# Patient Record
Sex: Female | Born: 1940
Health system: Southern US, Community
[De-identification: ages and names within clinical notes are randomized; demographics above are authoritative.]

## PROBLEM LIST (undated history)

## (undated) DIAGNOSIS — R202 Paresthesia of skin: Secondary | ICD-10-CM

## (undated) DIAGNOSIS — K219 Gastro-esophageal reflux disease without esophagitis: Secondary | ICD-10-CM

## (undated) DIAGNOSIS — M542 Cervicalgia: Secondary | ICD-10-CM

## (undated) DIAGNOSIS — I10 Essential (primary) hypertension: Secondary | ICD-10-CM

## (undated) DIAGNOSIS — J189 Pneumonia, unspecified organism: Secondary | ICD-10-CM

## (undated) DIAGNOSIS — M545 Low back pain, unspecified: Secondary | ICD-10-CM

## (undated) DIAGNOSIS — R011 Cardiac murmur, unspecified: Secondary | ICD-10-CM

## (undated) DIAGNOSIS — I6529 Occlusion and stenosis of unspecified carotid artery: Secondary | ICD-10-CM

## (undated) DIAGNOSIS — R519 Headache, unspecified: Secondary | ICD-10-CM

## (undated) DIAGNOSIS — E785 Hyperlipidemia, unspecified: Secondary | ICD-10-CM

## (undated) DIAGNOSIS — G43909 Migraine, unspecified, not intractable, without status migrainosus: Secondary | ICD-10-CM

## (undated) DIAGNOSIS — F329 Major depressive disorder, single episode, unspecified: Secondary | ICD-10-CM

## (undated) DIAGNOSIS — I2510 Atherosclerotic heart disease of native coronary artery without angina pectoris: Secondary | ICD-10-CM

## (undated) DIAGNOSIS — R251 Tremor, unspecified: Secondary | ICD-10-CM

## (undated) DIAGNOSIS — G8929 Other chronic pain: Secondary | ICD-10-CM

## (undated) DIAGNOSIS — R51 Headache: Secondary | ICD-10-CM

## (undated) DIAGNOSIS — E119 Type 2 diabetes mellitus without complications: Secondary | ICD-10-CM

## (undated) DIAGNOSIS — R42 Dizziness and giddiness: Secondary | ICD-10-CM

## (undated) DIAGNOSIS — F32A Depression, unspecified: Secondary | ICD-10-CM

## (undated) HISTORY — PX: ESOPHAGOGASTRODUODENOSCOPY: SHX1529

## (undated) HISTORY — PX: COLONOSCOPY: SHX174

## (undated) HISTORY — PX: COLON SURGERY: SHX602

## (undated) HISTORY — PX: ABDOMINAL HYSTERECTOMY: SHX81

## (undated) HISTORY — PX: LAPAROSCOPIC CHOLECYSTECTOMY: SUR755

## (undated) HISTORY — PX: TUBAL LIGATION: SHX77

## (undated) HISTORY — PX: CARPAL TUNNEL RELEASE: SHX101

---

## 2011-12-13 ENCOUNTER — Encounter (HOSPITAL_COMMUNITY)
Admission: RE | Admit: 2011-12-13 | Discharge: 2011-12-13 | Disposition: A | Discharge status: Home / Self Care | Payer: Medicare Other | Source: Ambulatory Visit | Attending: Urology | Admitting: Urology

## 2011-12-13 ENCOUNTER — Encounter (HOSPITAL_COMMUNITY): Payer: Self-pay | Admitting: Pharmacy Technician

## 2011-12-13 ENCOUNTER — Encounter (HOSPITAL_COMMUNITY): Payer: Self-pay

## 2011-12-13 ENCOUNTER — Other Ambulatory Visit: Payer: Self-pay

## 2011-12-13 HISTORY — DX: Essential (primary) hypertension: I10

## 2011-12-13 LAB — DIFFERENTIAL
Basophils Relative: 0 % (ref 0–1)
Eosinophils Absolute: 0.1 10*3/uL (ref 0.0–0.7)
Eosinophils Relative: 1 % (ref 0–5)
Monocytes Absolute: 0.6 10*3/uL (ref 0.1–1.0)
Monocytes Relative: 8 % (ref 3–12)
Neutrophils Relative %: 66 % (ref 43–77)

## 2011-12-13 LAB — CBC
HCT: 38.8 % (ref 36.0–46.0)
Hemoglobin: 12.9 g/dL (ref 12.0–15.0)
MCH: 28.4 pg (ref 26.0–34.0)
MCHC: 33.2 g/dL (ref 30.0–36.0)
MCV: 85.3 fL (ref 78.0–100.0)

## 2011-12-13 LAB — BASIC METABOLIC PANEL
BUN: 32 mg/dL — ABNORMAL HIGH (ref 6–23)
Calcium: 10 mg/dL (ref 8.4–10.5)
Creatinine, Ser: 0.92 mg/dL (ref 0.50–1.10)
GFR calc Af Amer: 72 mL/min — ABNORMAL LOW (ref >90)
GFR calc non Af Amer: 62 mL/min — ABNORMAL LOW (ref >90)
Glucose, Bld: 111 mg/dL — ABNORMAL HIGH (ref 70–99)

## 2011-12-13 LAB — SURGICAL PCR SCREEN: MRSA, PCR: NEGATIVE

## 2011-12-13 NOTE — Patient Instructions (Signed)
PATIENT INSTRUCTIONS POST-ANESTHESIA  IMMEDIATELY FOLLOWING SURGERY:  Do not drive or operate machinery for the first twenty four hours after surgery.  Do not make any important decisions for twenty four hours after surgery or while taking narcotic pain medications or sedatives.  If you develop intractable nausea and vomiting or a severe headache please notify your doctor immediately.  FOLLOW-UP:  Please make an appointment with your surgeon as instructed. You do not need to follow up with anesthesia unless specifically instructed to do so.  WOUND CARE INSTRUCTIONS (if applicable):  Keep a dry clean dressing on the anesthesia/puncture wound site if there is drainage.  Once the wound has quit draining you may leave it open to air.  Generally you should leave the bandage intact for twenty four hours unless there is drainage.  If the epidural site drains for more than 36-48 hours please call the anesthesia department.  QUESTIONS?:  Please feel free to call your physician or the hospital operator if you have any questions, and they will be happy to assist you.     [facility] Anesthesia Department [address] [place] [phone]    20 Lillianne Eick  12/13/2011   Your procedure is scheduled on:  12/14/11  Report to [facility] at 1215 pm  Call this number if you have problems the morning of surgery: [phone]   Remember:   Do not eat food:After Midnight.  May have clear liquids:until Midnight .  Clear liquids include soda, tea, black coffee, apple or grape juice, broth.  Take these medicines the morning of surgery with A SIP OF WATER: valsartan, omperazole, simvastatin,propanolol   Do not wear jewelry, make-up or nail polish.  Do not wear lotions, powders, or perfumes. You may wear deodorant.  Do not shave 48 hours prior to surgery.  Do not bring valuables to the hospital.  Contacts, dentures or bridgework may not be worn into surgery.  Leave suitcase in the car. After  surgery it may be brought to your room.  For patients admitted to the hospital, checkout time is 11:00 AM the day of discharge.   Patients discharged the day of surgery will not be allowed to drive home.  Name and phone number of your driver: husband  Special Instructions: CHG Shower Use Special Wash: 1/2 bottle night before surgery and 1/2 bottle morning of surgery.   Please read over the following fact sheets that you were given: Pain Booklet, MRSA Information, Surgical Site Infection Prevention, Anesthesia Post-op Instructions and Care and Recovery After Surgery

## 2011-12-14 ENCOUNTER — Ambulatory Visit (HOSPITAL_COMMUNITY): Payer: Medicare Other | Admitting: Anesthesiology

## 2011-12-14 ENCOUNTER — Other Ambulatory Visit: Payer: Self-pay | Admitting: Urology

## 2011-12-14 ENCOUNTER — Encounter (HOSPITAL_COMMUNITY): Payer: Self-pay | Admitting: Anesthesiology

## 2011-12-14 ENCOUNTER — Encounter (HOSPITAL_COMMUNITY)
Admission: RE | Disposition: A | Discharge status: Home / Self Care | Payer: Self-pay | Source: Ambulatory Visit | Attending: Urology

## 2011-12-14 ENCOUNTER — Encounter (HOSPITAL_COMMUNITY): Payer: Self-pay | Admitting: *Deleted

## 2011-12-14 ENCOUNTER — Ambulatory Visit (HOSPITAL_COMMUNITY)
Admission: RE | Admit: 2011-12-14 | Discharge: 2011-12-14 | Disposition: A | Discharge status: Home / Self Care | Payer: Medicare Other | Source: Ambulatory Visit | Attending: Urology | Admitting: Urology

## 2011-12-14 DIAGNOSIS — N3020 Other chronic cystitis without hematuria: Secondary | ICD-10-CM

## 2011-12-14 DIAGNOSIS — I10 Essential (primary) hypertension: Secondary | ICD-10-CM | POA: Insufficient documentation

## 2011-12-14 DIAGNOSIS — E119 Type 2 diabetes mellitus without complications: Secondary | ICD-10-CM | POA: Insufficient documentation

## 2011-12-14 DIAGNOSIS — Z79899 Other long term (current) drug therapy: Secondary | ICD-10-CM | POA: Insufficient documentation

## 2011-12-14 DIAGNOSIS — Z01812 Encounter for preprocedural laboratory examination: Secondary | ICD-10-CM | POA: Insufficient documentation

## 2011-12-14 HISTORY — PX: CYSTOSCOPY WITH BIOPSY: SHX5122

## 2011-12-14 HISTORY — PX: CYSTOSCOPY WITH URETHRAL DILATATION: SHX5125

## 2011-12-14 SURGERY — CYSTOSCOPY, WITH BIOPSY
Anesthesia: Monitor Anesthesia Care | Site: Bladder | Wound class: Clean Contaminated

## 2011-12-14 MED ORDER — CIPROFLOXACIN IN D5W 400 MG/200ML IV SOLN
400.0000 mg | Freq: Two times a day (BID) | INTRAVENOUS | Status: DC
  Administered 2011-12-14: 400 mg via INTRAVENOUS

## 2011-12-14 MED ORDER — FENTANYL CITRATE 0.05 MG/ML IJ SOLN
INTRAMUSCULAR | Status: AC
  Filled 2011-12-14: qty 2

## 2011-12-14 MED ORDER — MIDAZOLAM HCL 2 MG/2ML IJ SOLN
INTRAMUSCULAR | Status: AC
  Filled 2011-12-14: qty 2

## 2011-12-14 MED ORDER — FENTANYL CITRATE 0.05 MG/ML IJ SOLN
25.0000 ug | INTRAMUSCULAR | Status: DC | PRN
  Administered 2011-12-14 (×2): 25 ug via INTRAVENOUS

## 2011-12-14 MED ORDER — FENTANYL CITRATE 0.05 MG/ML IJ SOLN
INTRAMUSCULAR | Status: DC | PRN
  Administered 2011-12-14 (×4): 50 ug via INTRAVENOUS

## 2011-12-14 MED ORDER — FLUCONAZOLE 150 MG PO TABS: 150.0000 mg | ORAL_TABLET | Freq: Every day | ORAL | Status: DC

## 2011-12-14 MED ORDER — SODIUM CHLORIDE 0.9 % IR SOLN: Status: DC | PRN

## 2011-12-14 MED ORDER — GLYCINE 1.5 % IR SOLN
Status: DC | PRN
  Administered 2011-12-14: 6000 mL

## 2011-12-14 MED ORDER — LIDOCAINE HCL (CARDIAC) 10 MG/ML IV SOLN
INTRAVENOUS | Status: DC | PRN
  Administered 2011-12-14: 50 mg via INTRAVENOUS

## 2011-12-14 MED ORDER — STERILE WATER FOR IRRIGATION IR SOLN
Status: DC | PRN
  Administered 2011-12-14: 1000 mL

## 2011-12-14 MED ORDER — ETOMIDATE 2 MG/ML IV SOLN
INTRAVENOUS | Status: AC
  Filled 2011-12-14: qty 10

## 2011-12-14 MED ORDER — LIDOCAINE VISCOUS 2 % MT SOLN
OROMUCOSAL | Status: DC | PRN
  Administered 2011-12-14: 1

## 2011-12-14 MED ORDER — FLUCONAZOLE 100 MG PO TABS
150.0000 mg | ORAL_TABLET | Freq: Every day | ORAL | Status: AC
  Administered 2011-12-14: 150 mg via ORAL
  Filled 2011-12-14: qty 1.5

## 2011-12-14 MED ORDER — LACTATED RINGERS IV SOLN
INTRAVENOUS | Status: DC | PRN
  Administered 2011-12-14: 13:00:00 via INTRAVENOUS

## 2011-12-14 MED ORDER — ONDANSETRON HCL 4 MG/2ML IJ SOLN: 4.0000 mg | Freq: Once | INTRAMUSCULAR | Status: DC | PRN

## 2011-12-14 MED ORDER — FENTANYL CITRATE 0.05 MG/ML IJ SOLN
INTRAMUSCULAR | Status: AC
  Administered 2011-12-14: 25 ug via INTRAVENOUS
  Filled 2011-12-14: qty 2

## 2011-12-14 MED ORDER — FLUCONAZOLE 150 MG PO TABS: 150.0000 mg | ORAL_TABLET | Freq: Once | ORAL | Status: AC

## 2011-12-14 MED ORDER — PROPOFOL 10 MG/ML IV EMUL
INTRAVENOUS | Status: AC
  Filled 2011-12-14: qty 20

## 2011-12-14 MED ORDER — CIPROFLOXACIN IN D5W 400 MG/200ML IV SOLN
INTRAVENOUS | Status: DC | PRN
  Administered 2011-12-14: 200 mg via INTRAVENOUS

## 2011-12-14 MED ORDER — LACTATED RINGERS IV SOLN
INTRAVENOUS | Status: DC
  Administered 2011-12-14: 13:00:00 via INTRAVENOUS

## 2011-12-14 MED ORDER — LIDOCAINE HCL (PF) 1 % IJ SOLN
INTRAMUSCULAR | Status: AC
  Filled 2011-12-14: qty 5

## 2011-12-14 MED ORDER — MIDAZOLAM HCL 5 MG/5ML IJ SOLN
INTRAMUSCULAR | Status: DC | PRN
  Administered 2011-12-14: 2 mg via INTRAVENOUS

## 2011-12-14 MED ORDER — PROPOFOL 10 MG/ML IV EMUL
INTRAVENOUS | Status: DC | PRN
  Administered 2011-12-14: 100 ug/kg/min via INTRAVENOUS

## 2011-12-14 MED ORDER — MIDAZOLAM HCL 2 MG/2ML IJ SOLN
1.0000 mg | INTRAMUSCULAR | Status: DC | PRN
  Administered 2011-12-14: 2 mg via INTRAVENOUS

## 2011-12-14 MED ORDER — LIDOCAINE VISCOUS 2 % MT SOLN
OROMUCOSAL | Status: AC
  Filled 2011-12-14: qty 15

## 2011-12-14 SURGICAL SUPPLY — 20 items
BAG DRAIN URO TABLE W/ADPT NS (DRAPE) ×3 IMPLANT
BAG HAMPER (MISCELLANEOUS) ×3 IMPLANT
CLOTH BEACON ORANGE TIMEOUT ST (SAFETY) ×3 IMPLANT
FORMALIN 10 PREFIL 120ML (MISCELLANEOUS) IMPLANT
GLOVE BIO SURGEON STRL SZ7 (GLOVE) ×3 IMPLANT
GLOVE ECLIPSE 7.0 STRL STRAW (GLOVE) ×3 IMPLANT
GLOVE EXAM NITRILE MD LF STRL (GLOVE) ×3 IMPLANT
GLOVE SS BIOGEL STRL SZ 6.5 (GLOVE) ×2 IMPLANT
GLOVE SUPERSENSE BIOGEL SZ 6.5 (GLOVE) ×1
GLYCINE 1.5% IRRIG UROMATIC (IV SOLUTION) ×6 IMPLANT
GOWN STRL REIN XL XLG (GOWN DISPOSABLE) ×3 IMPLANT
IV NS IRRIG 3000ML ARTHROMATIC (IV SOLUTION) IMPLANT
KIT ROOM TURNOVER AP CYSTO (KITS) ×3 IMPLANT
MANIFOLD NEPTUNE II (INSTRUMENTS) ×3 IMPLANT
NEEDLE HYPO 18GX1.5 BLUNT FILL (NEEDLE) IMPLANT
PACK CYSTO (CUSTOM PROCEDURE TRAY) ×3 IMPLANT
PAD ARMBOARD 7.5X6 YLW CONV (MISCELLANEOUS) ×3 IMPLANT
PAD TELFA 3X4 1S STER (GAUZE/BANDAGES/DRESSINGS) ×3 IMPLANT
TOWEL OR 17X26 4PK STRL BLUE (TOWEL DISPOSABLE) ×3 IMPLANT
WATER STERILE IRR 1000ML POUR (IV SOLUTION) ×3 IMPLANT

## 2011-12-14 NOTE — Preoperative (Signed)
Beta Blockers   Reason not to administer Beta Blockers:Not Applicable 

## 2011-12-14 NOTE — Transfer of Care (Signed)
Immediate Anesthesia Transfer of Care Note  Patient: Alexa Mcpherson  Procedure(s) Performed:  CYSTOSCOPY WITH BIOPSY - Cystoscopy with Bladder Biopsies  Patient Location: PACU  Anesthesia Type: MAC  Level of Consciousness: awake, alert , oriented and patient cooperative  Airway & Oxygen Therapy: Patient Spontanous Breathing and Patient connected to face mask oxygen  Post-op Assessment: Report given to PACU RN and Post -op Vital signs reviewed and stable  Post vital signs: Reviewed and stable  Complications: No apparent anesthesia complications

## 2011-12-14 NOTE — Anesthesia Preprocedure Evaluation (Addendum)
Anesthesia Evaluation  Patient identified by MRN, date of birth, ID band Patient awake    Reviewed: Allergy & Precautions, H&P , NPO status , Patient's Chart, lab work & pertinent test results, reviewed documented beta blocker date and time   History of Anesthesia Complications Negative for: history of anesthetic complications  Airway Mallampati: II      Dental  (+) Teeth Intact   Pulmonary neg pulmonary ROS,  clear to auscultation        Cardiovascular hypertension, Pt. on medications Regular Normal    Neuro/Psych    GI/Hepatic GERD-  Medicated and Controlled,  Endo/Other  Diabetes mellitus-, Well Controlled, Type 2, Oral Hypoglycemic Agents  Renal/GU      Musculoskeletal   Abdominal   Peds  Hematology   Anesthesia Other Findings   Reproductive/Obstetrics                           Anesthesia Physical Anesthesia Plan  ASA: III  Anesthesia Plan: MAC   Post-op Pain Management:    Induction: Intravenous  Airway Management Planned: Nasal Cannula  Additional Equipment:   Intra-op Plan:   Post-operative Plan:   Informed Consent: I have reviewed the patients History and Physical, chart, labs and discussed the procedure including the risks, benefits and alternatives for the proposed anesthesia with the patient or authorized representative who has indicated his/her understanding and acceptance.     Plan Discussed with:   Anesthesia Plan Comments:         Anesthesia Quick Evaluation

## 2011-12-14 NOTE — Progress Notes (Signed)
Pt reexamined and no change in H& P. 

## 2011-12-14 NOTE — Brief Op Note (Signed)
12/14/2011  1:45 PM  PATIENT:  Alexa Mcpherson  70 y.o. female  PRE-OPERATIVE DIAGNOSIS:  chronic cystitis  POST-OPERATIVE DIAGNOSIS:  chronic cystitis  PROCEDURE:  Procedure(s): CYSTOSCOPY WITH BIOPSY  SURGEON:  Surgeon(s): Ky Barban  PHYSICIAN ASSISTANT:   ASSISTANTS: none   ANESTHESIA:   IV sedation  EBL:  Total I/O In: 1000 [I.V.:1000] Out: 0   BLOOD ADMINISTERED:none  DRAINS: none   LOCAL MEDICATIONS USED:  NONE  SPECIMEN:  Biopsy / Limited Resection  DISPOSITION OF SPECIMEN:  PATHOLOGY  COUNTS:  YES  TOURNIQUET:  * No tourniquets in log *  DICTATION: .Other Dictation: Dictation Number [id number]  PLAN OF CARE: Discharge to home after PACU  PATIENT DISPOSITION:  PACU - hemodynamically stable.   Delay start of Pharmacological VTE agent (>24hrs) due to surgical blood loss or risk of bleeding:  {YES

## 2011-12-14 NOTE — H&P (Signed)
NAMEROLENE, ANDRADES              ACCOUNT NO.:  [id number]  MEDICAL RECORD NO.:  [id number]  LOCATION:  DOIB                          FACILITY:  [facility]  PHYSICIAN:  Ky Barban, M.D.DATE OF BIRTH:  [birth year]  DATE OF ADMISSION:  12/13/2011 DATE OF DISCHARGE:  12/17/2012LH                             HISTORY & PHYSICAL   A 70 year old female is having lot of her urinary symptoms for several months.  She was initially followed up by Dr. Beatris Ship in [place] and now she comes over here complaining of urgency, frequency, pelvic pressure, and discomfort and I reviewed her records from Dr. Michiel Cowboy office.  She has several positive cultures and she was treated with antibiotics.  There is one time early this year that she says she is feeling better as far as frequency, urgency is concern, but she continued to complain of pelvic discomfort.  So, I cystoscoped her recently with CMG.  CMG was normal, but cystoscopy showed lot of ulceration in the bladder, which is consistent with chronic cystitis. So, I have suggested that we do bladder biopsy under anesthesia, which I discussed with the patient.  She understands, want me to go ahead and proceed with that.  She had several other medical problems, which include noninsulin-dependent diabetes, hypertension, migraines, backache, and depression.  SURGICAL HISTORY:  Cholecystectomy.  FAMILY HISTORY:  Father has multiple sclerosis.  PERSONAL HISTORY:  She does not smoke or drink.  REVIEW OF SYSTEMS:  Unremarkable.  PHYSICAL EXAMINATION:  VITAL SIGNS:  Blood pressure 160/80, temperature is normal. CENTRAL NERVOUS SYSTEM:  No gross neurological deficit. HEAD, NECK, EYE, ENT:  Negative. CHEST:  Symmetrical. HEART:  Regular sinus rhythm. ABDOMEN:  Soft, flat.  Liver, spleen, and kidneys are not palpable. PELVIC EXAM:  There is tenderness along the urethra and the trigone part of the bladder.  No adnexal mass or tenderness.  IMPRESSION:   Possible chronic cystitis, senile vaginitis, mixed incontinence, stress and urge incontinence, and backache.  PLAN:  Cysto with multiple bladder biopsies under anesthesia as outpatient.     Ky Barban, M.D.     MIJ/MEDQ  D:  12/13/2011  T:  12/14/2011  Job:  [id number]

## 2011-12-14 NOTE — Anesthesia Postprocedure Evaluation (Signed)
  Anesthesia Post-op Note  Patient: Alexa Mcpherson  Procedure(s) Performed:  CYSTOSCOPY WITH BIOPSY - Cystoscopy with Bladder Biopsies, Urethral Dilation, Bladder Fulguration  Patient Location: PACU  Anesthesia Type: MAC  Level of Consciousness: awake, alert , oriented and patient cooperative  Airway and Oxygen Therapy: Patient Spontanous Breathing and Patient connected to face mask oxygen  Post-op Pain: none  Post-op Assessment: Post-op Vital signs reviewed, Patient's Cardiovascular Status Stable, Respiratory Function Stable, Patent Airway, No signs of Nausea or vomiting and Pain level controlled  Post-op Vital Signs: Reviewed and stable  Complications: No apparent anesthesia complications

## 2011-12-15 NOTE — Op Note (Signed)
NAMEVELERA, Alexa Mcpherson              ACCOUNT NO.:  [id number]  MEDICAL RECORD NO.:  [id number]  LOCATION:                                 FACILITY:  PHYSICIAN:  Ky Barban, M.D.DATE OF BIRTH:  16-May-1941  DATE OF PROCEDURE: DATE OF DISCHARGE:                              OPERATIVE REPORT   PREOPERATIVE DIAGNOSIS:  Recurrent cystitis, chronic cystitis.  PROCEDURES:  Cysto bladder biopsy, fulguration of bladder mucosa, urethral dilation, hydrodistention of bladder.  ANESTHESIA:  IV MAC.  PROCEDURE:  The patient under MAC anesthesia in lithotomy position, usual prep and drape.  I could not get the cystoscope #25 in because the urethra is very tight.  I dilated the urethra with 36 [place] and introduced the cystoscope.  Bladder showed diffuse erythema in the posterior bladder wall with bullous edema and some of the areas of ulceration and bleeding spots and then using a flexible biopsy forceps a couple of biopsies from these areas were taken with the help of a flexible biopsy forceps.  Now using Greenwald electrode, I cauterized the mucosa to stop the bleeding and also I cauterized the bladder neck because there was lot of bullous edema around the bladder neck and the proximal urethra.  I did this away from the ureteral orifices, mostly behind the trigone on the posterior bladder wall.  Once this was done, there was no bleeding.  I checked the capacity of the bladder and distended the bladder with 350 mL of saline.  Cystoscope was removed. The patient left the operating room in satisfactory condition.     Ky Barban, M.D.     MIJ/MEDQ  D:  12/14/2011  T:  12/15/2011  Job:  [id number]

## 2011-12-17 ENCOUNTER — Encounter (HOSPITAL_COMMUNITY): Payer: Self-pay | Admitting: Urology

## 2011-12-28 HISTORY — PX: CATARACT EXTRACTION, BILATERAL: SHX1313

## 2014-05-22 ENCOUNTER — Other Ambulatory Visit: Payer: Self-pay | Admitting: Gastroenterology

## 2014-05-22 DIAGNOSIS — R1013 Epigastric pain: Secondary | ICD-10-CM

## 2014-05-27 ENCOUNTER — Other Ambulatory Visit: Payer: Self-pay | Admitting: Gastroenterology

## 2014-05-27 DIAGNOSIS — R109 Unspecified abdominal pain: Secondary | ICD-10-CM

## 2014-05-27 DIAGNOSIS — R1013 Epigastric pain: Secondary | ICD-10-CM

## 2014-05-29 ENCOUNTER — Other Ambulatory Visit: Payer: Medicare Other

## 2014-05-30 ENCOUNTER — Ambulatory Visit
Admission: RE | Admit: 2014-05-30 | Discharge: 2014-05-30 | Disposition: A | Discharge status: Home / Self Care | Payer: Medicare Other | Source: Ambulatory Visit | Attending: Gastroenterology | Admitting: Gastroenterology

## 2014-05-30 DIAGNOSIS — R1013 Epigastric pain: Secondary | ICD-10-CM

## 2014-05-31 ENCOUNTER — Other Ambulatory Visit: Payer: Medicare Other

## 2014-06-06 ENCOUNTER — Ambulatory Visit
Admission: RE | Admit: 2014-06-06 | Discharge: 2014-06-06 | Disposition: A | Discharge status: Home / Self Care | Payer: Medicare Other | Source: Ambulatory Visit | Attending: Gastroenterology | Admitting: Gastroenterology

## 2014-06-06 DIAGNOSIS — R109 Unspecified abdominal pain: Secondary | ICD-10-CM

## 2014-06-06 MED ORDER — IOHEXOL 300 MG/ML  SOLN
100.0000 mL | Freq: Once | INTRAMUSCULAR | Status: AC | PRN
  Administered 2014-06-06: 100 mL via INTRAVENOUS

## 2014-09-24 DIAGNOSIS — R0989 Other specified symptoms and signs involving the circulatory and respiratory systems: Secondary | ICD-10-CM | POA: Insufficient documentation

## 2014-09-24 DIAGNOSIS — K219 Gastro-esophageal reflux disease without esophagitis: Secondary | ICD-10-CM | POA: Insufficient documentation

## 2014-09-24 DIAGNOSIS — R9439 Abnormal result of other cardiovascular function study: Secondary | ICD-10-CM | POA: Insufficient documentation

## 2014-09-24 HISTORY — PX: CORONARY ARTERY BYPASS GRAFT: SHX141

## 2014-09-25 DIAGNOSIS — I2510 Atherosclerotic heart disease of native coronary artery without angina pectoris: Secondary | ICD-10-CM | POA: Insufficient documentation

## 2014-09-30 DIAGNOSIS — Z951 Presence of aortocoronary bypass graft: Secondary | ICD-10-CM | POA: Insufficient documentation

## 2014-12-27 DIAGNOSIS — I6523 Occlusion and stenosis of bilateral carotid arteries: Secondary | ICD-10-CM | POA: Insufficient documentation

## 2015-05-05 DIAGNOSIS — I739 Peripheral vascular disease, unspecified: Secondary | ICD-10-CM | POA: Insufficient documentation

## 2015-08-25 ENCOUNTER — Other Ambulatory Visit (HOSPITAL_COMMUNITY): Payer: Self-pay | Admitting: Orthopaedic Surgery

## 2015-08-29 ENCOUNTER — Encounter (HOSPITAL_COMMUNITY): Payer: Self-pay

## 2015-08-29 ENCOUNTER — Encounter (HOSPITAL_COMMUNITY)
Admission: RE | Admit: 2015-08-29 | Discharge: 2015-08-29 | Disposition: A | Discharge status: Home / Self Care | Payer: Medicare Other | Source: Ambulatory Visit | Attending: Orthopaedic Surgery | Admitting: Orthopaedic Surgery

## 2015-08-29 DIAGNOSIS — I2510 Atherosclerotic heart disease of native coronary artery without angina pectoris: Secondary | ICD-10-CM | POA: Diagnosis not present

## 2015-08-29 DIAGNOSIS — Z951 Presence of aortocoronary bypass graft: Secondary | ICD-10-CM | POA: Insufficient documentation

## 2015-08-29 DIAGNOSIS — Z79899 Other long term (current) drug therapy: Secondary | ICD-10-CM | POA: Insufficient documentation

## 2015-08-29 DIAGNOSIS — E119 Type 2 diabetes mellitus without complications: Secondary | ICD-10-CM | POA: Diagnosis not present

## 2015-08-29 DIAGNOSIS — I10 Essential (primary) hypertension: Secondary | ICD-10-CM | POA: Insufficient documentation

## 2015-08-29 DIAGNOSIS — Z7982 Long term (current) use of aspirin: Secondary | ICD-10-CM | POA: Diagnosis not present

## 2015-08-29 DIAGNOSIS — Z01812 Encounter for preprocedural laboratory examination: Secondary | ICD-10-CM | POA: Diagnosis not present

## 2015-08-29 DIAGNOSIS — I6522 Occlusion and stenosis of left carotid artery: Secondary | ICD-10-CM | POA: Insufficient documentation

## 2015-08-29 DIAGNOSIS — Z01818 Encounter for other preprocedural examination: Secondary | ICD-10-CM | POA: Diagnosis not present

## 2015-08-29 DIAGNOSIS — E785 Hyperlipidemia, unspecified: Secondary | ICD-10-CM | POA: Insufficient documentation

## 2015-08-29 DIAGNOSIS — K219 Gastro-esophageal reflux disease without esophagitis: Secondary | ICD-10-CM | POA: Insufficient documentation

## 2015-08-29 DIAGNOSIS — M4802 Spinal stenosis, cervical region: Secondary | ICD-10-CM | POA: Diagnosis not present

## 2015-08-29 HISTORY — DX: Major depressive disorder, single episode, unspecified: F32.9

## 2015-08-29 HISTORY — DX: Dizziness and giddiness: R42

## 2015-08-29 HISTORY — DX: Tremor, unspecified: R25.1

## 2015-08-29 HISTORY — DX: Pneumonia, unspecified organism: J18.9

## 2015-08-29 HISTORY — DX: Hyperlipidemia, unspecified: E78.5

## 2015-08-29 HISTORY — DX: Paresthesia of skin: R20.2

## 2015-08-29 HISTORY — DX: Depression, unspecified: F32.A

## 2015-08-29 HISTORY — DX: Gastro-esophageal reflux disease without esophagitis: K21.9

## 2015-08-29 HISTORY — DX: Atherosclerotic heart disease of native coronary artery without angina pectoris: I25.10

## 2015-08-29 HISTORY — DX: Occlusion and stenosis of unspecified carotid artery: I65.29

## 2015-08-29 HISTORY — DX: Cervicalgia: M54.2

## 2015-08-29 LAB — SURGICAL PCR SCREEN
MRSA, PCR: NEGATIVE
Staphylococcus aureus: NEGATIVE

## 2015-08-29 LAB — COMPREHENSIVE METABOLIC PANEL
ALT: 92 U/L — ABNORMAL HIGH (ref 14–54)
AST: 70 U/L — ABNORMAL HIGH (ref 15–41)
Albumin: 4.4 g/dL (ref 3.5–5.0)
Alkaline Phosphatase: 61 U/L (ref 38–126)
Anion gap: 8 (ref 5–15)
BUN: 29 mg/dL — ABNORMAL HIGH (ref 6–20)
CHLORIDE: 106 mmol/L (ref 101–111)
CO2: 24 mmol/L (ref 22–32)
Calcium: 10.2 mg/dL (ref 8.9–10.3)
Creatinine, Ser: 1.11 mg/dL — ABNORMAL HIGH (ref 0.44–1.00)
GFR, EST AFRICAN AMERICAN: 56 mL/min — AB (ref >60)
GFR, EST NON AFRICAN AMERICAN: 48 mL/min — AB (ref >60)
Glucose, Bld: 73 mg/dL (ref 65–99)
POTASSIUM: 4.5 mmol/L (ref 3.5–5.1)
SODIUM: 138 mmol/L (ref 135–145)
Total Bilirubin: 1 mg/dL (ref 0.3–1.2)
Total Protein: 8.4 g/dL — ABNORMAL HIGH (ref 6.5–8.1)

## 2015-08-29 LAB — URINALYSIS, ROUTINE W REFLEX MICROSCOPIC
BILIRUBIN URINE: NEGATIVE
Glucose, UA: NEGATIVE mg/dL
Hgb urine dipstick: NEGATIVE
KETONES UR: NEGATIVE mg/dL
NITRITE: NEGATIVE
PROTEIN: NEGATIVE mg/dL
Specific Gravity, Urine: 1.01 (ref 1.005–1.030)
UROBILINOGEN UA: 0.2 mg/dL (ref 0.0–1.0)
pH: 6.5 (ref 5.0–8.0)

## 2015-08-29 LAB — CBC
HCT: 42.4 % (ref 36.0–46.0)
Hemoglobin: 13.5 g/dL (ref 12.0–15.0)
MCH: 26.3 pg (ref 26.0–34.0)
MCHC: 31.8 g/dL (ref 30.0–36.0)
MCV: 82.7 fL (ref 78.0–100.0)
PLATELETS: 311 10*3/uL (ref 150–400)
RBC: 5.13 MIL/uL — AB (ref 3.87–5.11)
RDW: 16.1 % — AB (ref 11.5–15.5)
WBC: 11.9 10*3/uL — AB (ref 4.0–10.5)

## 2015-08-29 LAB — PROTIME-INR
INR: 1.1 (ref 0.00–1.49)
PROTHROMBIN TIME: 14.4 s (ref 11.6–15.2)

## 2015-08-29 LAB — GLUCOSE, CAPILLARY: GLUCOSE-CAPILLARY: 65 mg/dL (ref 65–99)

## 2015-08-29 LAB — URINE MICROSCOPIC-ADD ON

## 2015-08-29 MED ORDER — CHLORHEXIDINE GLUCONATE 4 % EX LIQD: 60.0000 mL | Freq: Once | CUTANEOUS | Status: DC

## 2015-08-29 NOTE — Progress Notes (Addendum)
Alexa Mcpherson,scheduler with Dr.Yates called me back in reference to ASA.Dr.Yates and his nurse are both out of the office but per Dr.Xu pt needs to stop ASA today.Spoke with pt notifying her of this and she verbalized understanding.

## 2015-08-29 NOTE — Pre-Procedure Instructions (Signed)
Alexa Mcpherson  08/29/2015      [id number] [place], [place] [address] [place] [address] Phone: [phone] Fax: [phone]    Your procedure is scheduled on Wed, Sept 7 @ 12:30 PM  Report to [facility] Admitting at 10:30 AM  Call this number if you have problems the morning of surgery:  [phone]   Remember:  Do not eat food or drink liquids after midnight.  Take these medicines the morning of surgery with A SIP OF WATER Omeprazole(Prilosec),Propranolol(Inderal),and Tramadol(Ultram-if needed)               Stop taking your Aspirin. No Goody's,BC's,Aleve,Ibuprofen,Fish Oil,or any Herbal Medications.                 How to Manage Your Diabetes Before Surgery   Why is it important to control my blood sugar before and after surgery?   Improving blood sugar levels before and after surgery helps healing and can limit problems.  A way of improving blood sugar control is eating a healthy diet by:  - Eating less sugar and carbohydrates  - Increasing activity/exercise  - Talk with your doctor about reaching your blood sugar goals  High blood sugars (greater than 180 mg/dL) can raise your risk of infections and slow down your recovery so you will need to focus on controlling your diabetes during the weeks before surgery.  Make sure that the doctor who takes care of your diabetes knows about your planned surgery including the date and location.  How do I manage my blood sugars before surgery?   Check your blood sugar at least 4 times a day, 2 days before surgery to make sure that they are not too high or low.   Check your blood sugar the morning of your surgery when you wake up and every 2               hours until you get to the Short-Stay unit.  If your blood sugar is less than 70 mg/dL, you will need to treat for low blood sugar by:  Treat a low blood sugar (less than 70 mg/dL) with 1/2 cup  of clear juice (cranberry or apple), 4 glucose tablets, OR glucose gel.  Recheck blood sugar in 15 minutes after treatment (to make sure it is greater than 70 mg/dL).  If blood sugar is not greater than 70 mg/dL on re-check, call [phone] for further instructions.   Report your blood sugar to the Short-Stay nurse when you get to Short-Stay.  References:  [facility], 2007 "How to Manage your Diabetes Before and After Surgery".  What do I do about my diabetes medications?   Do not take oral diabetes medicines (pills) the morning of surgery.  THE NIGHT BEFORE SURGERY, take Usual Dose Of Victoza.         The Day Before Surgery only take Morning and Lunch doses of Glipizide,Glucovance,and Metformin.     THE MORNING OF SURGERY, take No Diabetes Medications.    Do not take other diabetes injectables the day of surgery including Byetta, Victoza, Bydureon, and Trulicity.    If your CBG is greater than 220 mg/dL, you may take 1/2 of your sliding scale (correction) dose of insulin.   For patients with "Insulin Pumps":  Contact your diabetes doctor for specific instructions before surgery.   Decrease basal insulin rates  by 20% at midnight the night before surgery.  Note that if your surgery is planned to be longer than 2 hours, your insulin pump will be removed and intravenous (IV) insulin will be started and managed by the nurses and anesthesiologist.  You will be able to restart your insulin pump once you are awake and able to manage it.  Make sure to bring insulin pump supplies to the hospital with you in case your site needs to be changed.         Do not wear jewelry, make-up or nail polish.  Do not wear lotions, powders, or perfumes.  You may wear deodorant.  Do not shave 48 hours prior to surgery.  Men may shave face and neck.  Do not bring valuables to the hospital.  [facility] is not responsible for any belongings or  valuables.  Contacts, dentures or bridgework may not be worn into surgery.  Leave your suitcase in the car.  After surgery it may be brought to your room.  For patients admitted to the hospital, discharge time will be determined by your treatment team.  Patients discharged the day of surgery will not be allowed to drive home.    Special instructions:  [place] - Preparing for Surgery  Before surgery, you can play an important role.  Because skin is not sterile, your skin needs to be as free of germs as possible.  You can reduce the number of germs on you skin by washing with CHG (chlorahexidine gluconate) soap before surgery.  CHG is an antiseptic cleaner which kills germs and bonds with the skin to continue killing germs even after washing.  Please DO NOT use if you have an allergy to CHG or antibacterial soaps.  If your skin becomes reddened/irritated stop using the CHG and inform your nurse when you arrive at Short Stay.  Do not shave (including legs and underarms) for at least 48 hours prior to the first CHG shower.  You may shave your face.  Please follow these instructions carefully:   1.  Shower with CHG Soap the night before surgery and the                                morning of Surgery.  2.  If you choose to wash your hair, wash your hair first as usual with your       normal shampoo.  3.  After you shampoo, rinse your hair and body thoroughly to remove the                      Shampoo.  4.  Use CHG as you would any other liquid soap.  You can apply chg directly       to the skin and wash gently with scrungie or a clean washcloth.  5.  Apply the CHG Soap to your body ONLY FROM THE NECK DOWN.        Do not use on open wounds or open sores.  Avoid contact with your eyes,       ears, mouth and genitals (private parts).  Wash genitals (private parts)       with your normal soap.  6.  Wash thoroughly, paying special attention to the area where your surgery        will be  performed.  7.  Thoroughly rinse your body with warm water from the neck down.  8.  DO NOT shower/wash with your normal soap after using and rinsing off       the CHG Soap.  9.  Pat yourself dry with a clean towel.            10.  Wear clean pajamas.            11.  Place clean sheets on your bed the night of your first shower and do not        sleep with pets.  Day of Surgery  Do not apply any lotions/deoderants the morning of surgery.  Please wear clean clothes to the hospital/surgery center.    Please read over the following fact sheets that you were given. Pain Booklet, Coughing and Deep Breathing, MRSA Information and Surgical Site Infection Prevention

## 2015-08-29 NOTE — Progress Notes (Signed)
Pt takes ASA 325mg  daily but wasn't told if she should hold it or not.Called and spoke with Dr.Yates' scheduler who will talk with MD and call me back. I notified pt of above info and I will call her when I know something.She verbalized understanding.

## 2015-08-29 NOTE — Progress Notes (Addendum)
Cardiologist is Dr.Clevenger with last visit about 3 months ago and follows up yrly  Medical Md is Dr.Xaje Hasanaj  Echo can't remember when this was done  Stress test in 2015-care everywhere   Heart cath denies ever having one  EKG and CXR in epic from 10/23-15 under care everywhere

## 2015-08-30 LAB — HEMOGLOBIN A1C
Hgb A1c MFr Bld: 7 % — ABNORMAL HIGH (ref 4.8–5.6)
Mean Plasma Glucose: 154 mg/dL

## 2015-09-02 ENCOUNTER — Encounter (HOSPITAL_COMMUNITY): Payer: Self-pay

## 2015-09-02 NOTE — Progress Notes (Addendum)
Anesthesia Chart Review: Patient is a 74 year old female scheduled for C5-6 ACDF on 09/03/15 by Dr. Lorin Mercy.  History includes non-smoker, GERD, HLD, depression, CAD s/p CABG X 4 09/25/14 (Dr. Lilia Pro), DM2, tremor (on propranolol), HTN, left carotid artery stenosis (60% 04/2015), cholecystectomy, fatty liver by 05/30/14 abdominal U/S. PCP is Dr. Stoney Bang.  Cardiologist is Dr. Hamilton Capri with [facility]). Telephone notes in [place] indicate that he signed a cardiac clearance note and had it faxed to [place].   Meds include ASA, glipizide, metformin, Prilosec, Paxil, Inderal, Zocor, tramadol, Diovan-HCT.   I called Dr. Dion Body office regarding getting the EKG tracing from 10/18/14. There is also question of 10/01/14 cardio stress test (see Care Everywhere, Other Results). If this was in fact done I asked Tammy to fax that as well. (She is s/p CABG 09/25/14, and there is no mention of subsequent testing in CT surgery or cardiology notes.) 10/18/14 interpretation as found in Care Everywhere: NSR, possible LAE, septal infarct (age undetermined), ST/T wave abnormality, consider anterolateral ischemia. When compared to 09/24/14 EKG, septal infarct is now present and T wave inversion is more evident in anterolateral leads.  09/24/14 PRE-CABG LHC:  Coronary anatomy: Left main: Has severe 99% distal stenosis Left anterior descending: Has 99% stenosis in the mid segment, first diagonal is large and has 70% stenosis proximally, there is right to left collaterals filling there LAD. Left circumflex: Has 70% mid left circumflex stenosis, second OM is large and has 70% stenosis proximally. Right coronary artery: Is large and provides collaterals both of the LAD and left circumflex, there is multiple stenosis in the mid segment up to 70%. Right dominant Left ventriculogram: The left ventricle is mildly dilated, there is anterior hypokinesis, EF is estimated at 40-45%, there is no mitral  regurgitation. Impression: 1. Successful insertion of an intra-aortic balloon pump, with ultrasound and fluoroscopic guidance. 2. Severe left main and three-vessel coronary artery disease with critical stenosis of distal left main. 3. Normal left ventricular end-diastolic pressure of 10 mm Hg. 4. Reduced ejection fraction of 40-45%, with anterior hypokinesis. 5. Coronary angiography performed with access through the right radial artery. Plan: CT surgical evaluation. She underwent CABG X 4 (LIMA to LAD, SVG to OM1, SVT to DA, SVG to RCA) on 09/25/14 at [facility].  05/23/15 CT Angio Neck: FINDINGS: - Aortic arch and brachiocephalic vessels: No significant abnormality -  Right vertebral: Normal - Left vertebral: Normal -  Right carotid artery: Calcified plaque in the carotid bifurcation region. No significant stenosis. -  Left carotid artery: Calcified plaque in the carotid bifurcation region. There is narrowing of the left internal carotid artery lumen 1.7 cm distal to the carotid bifurcation. Minimal lumen diameter is 2 mm. Normal lumen diameter is 5 mm. This corresponds to a 60% stenosis. - One year follow-up recommended (Dr. Glenford Bayley).  Preoperative labs noted. Cr 1.11. AST 70 (NL 15-41), ALT 92 (NL 14-54). LFTs were WNL 10/18/14 (Care Everywhere). She has known fatty infiltration of the liver, denied ETOH, s/p cholecystectomy. WBC 11.9. PLT 311. PT/INR WNL. A1C 7.0.    If EKG tracing not received then she will need an EKG on arrival. If regards to her elevated AST/ALT, results are no quite above two times normal, but since they appear newly elevated since 09/2014, I will go a head and repeat tomorrow to ensure there has not been a significant change. I'll add a PTT as well.  If labs are stable and otherwise  no acute changes then I would anticipate that she could proceed as planned.  [facility] Short Stay Center/Anesthesiology Phone [phone] 09/02/2015  11:11 AM  Addendum: Records received from [facility]. Tammy was only able to print the 09/24/14 tracing that showed SR, anterior infarct (age undetermined). There is also anterolateral T wave abnormality (negative in V1-5 and I and aVL).  The only stress test was from 09/24/14 (prior to [facility]) that was high risk with dyskinesis of the basal anterior, mid anterior, and mid anteroseptal segments, EF 40%. She was referred for urgent cardiac cath. (This stress test was done at [facility] and formally signed by Dr. Hamilton Capri on 10/01/14 which is why Care Everywhere has the Cardio Stress Test listed as 10/01/14 and not 09/24/14.)  Myra Gianotti, PA-C [facility] Short Stay Center/Anesthesiology Phone [phone] 09/02/2015 12:40 PM

## 2015-09-02 NOTE — H&P (Signed)
Alexa Mcpherson is an 74 y.o. female.   Patient returns with persistent and progressively increasing neck pain, left arm pain.  Pain is throbbing.  She states she is having progressive left arm weakness, less weakness on the right.  Pain radiates down to the radial side of her hand.  Her symptoms have been present for 4 months.  She has been taking Ultram without relief.  She has used aspirin.  She has been through some exercises, taken Ultram more frequently.  Due to her diabetes, she was not given a prednisone pack.  She denies any lower extremity symptoms.  No bowel or bladder symptoms.  Patient states, "Something has to be done about this.  I need this fixed."  Patient had previous cardiac surgery in October 2015.  She is followed by Dr. Sherrie Sport.   FOURTEEN-POINT REVIEW OF SYSTEMS:  Positive for acid reflux, diabetes, coronary artery disease, hypertension, previous bypass surgery, and migraines.   MEDICATIONS:  Includes metformin 500 mg b.i.d., valsartan 40 mg daily, propranolol 20 mg b.i.d., Ultram 50 mg t.i.d., paroxetine 40 mg 1 p.o. daily, Prilosec 40 mg daily, Zocor 40 mg daily, glipizide 5 mg 1 with supper, and aspirin 325 p.o. daily.   ALLERGIES:  NKDA.   FAMILY HISTORY:  Positive for diabetes and hypertension.   SOCIAL HISTORY:  She is married to her husband, Jeneen Rinks.  She has never smoked.  She does not drink.     Past Medical History  Diagnosis Date  . GERD (gastroesophageal reflux disease)     takes Omeprazole daily  . Hyperlipidemia     takes Simvastatin daily  . Depression     takes Paxil daily  . Coronary artery disease   . Pneumonia as a baby    hx of  . Headache   . Dizziness     r/t neck issues  . Tingling     weakness and numbness in left arm  . Neck pain   . Back pain     rarely  . Diabetes mellitus     takes Glipizide and Metformin;Fasting sugar 105-125  . Shakes     takes Propranolol daily  . Hypertension     takes Diovan,Diovan HCT daily  . Carotid  stenosis     left ICA 60% 04/2015 (1 yr f/u rec, Dr. Glenford Bayley)    Past Surgical History  Procedure Laterality Date  . Tubal ligation    . Cystoscopy with biopsy  12/14/2011    Procedure: CYSTOSCOPY WITH BIOPSY;  Surgeon: Marissa Nestle;  Location: [facility];  Service: Urology;  Laterality: N/A;  Cystoscopy with Bladder Biopsies, Bladder Fulguration  . Cystoscopy with urethral dilatation  12/14/2011    Procedure: CYSTOSCOPY WITH URETHRAL DILATATION;  Surgeon: Marissa Nestle;  Location: [facility];  Service: Urology;;  . Cholecystectomy    . Coronary artery bypass graft      x 4   . Cataract surgery  Bilateral   . Colonoscopy    . Esophagogastroduodenoscopy      No family history on file. Social History:  reports that she has never smoked. She does not have any smokeless tobacco history on file. She reports that she does not drink alcohol or use illicit drugs.  Allergies: No Known Allergies  No prescriptions prior to admission    No results found for this or any previous visit (from the past 48 hour(s)). No results found.  Review of Systems  Constitutional: Negative.   Eyes: Negative.  Respiratory: Negative.   Cardiovascular: Negative.   Gastrointestinal: Negative.   Genitourinary: Negative.   Musculoskeletal: Positive for neck pain.  Skin: Negative.   Neurological: Negative.     There were no vitals taken for this visit. Physical Exam  Constitutional: She is oriented to person, place, and time. No distress.  HENT:  Head: Atraumatic.  Eyes: EOM are normal.  Respiratory: No respiratory distress.  GI: She exhibits no distension.  Neurological: She is alert and oriented to person, place, and time.  Skin: Skin is warm and dry.  Psychiatric: She has a normal mood and affect.    Patient is 5 feet 4 inches, 155 pounds.  Alert and oriented.  Extraocular movements intact.  She uses glasses for reading.  Positive Spurling on the left.  Positive mild Spurling on the right.   Decreased sensation on the radial side of her hand in C6 distribution.  Previous median sternotomy.  No supraclavicular lymphadenopathy.  No lower extremity hyperreflexia.  Upper extremity reflexes are 2+ and symmetrical.  Biceps and triceps are intact.  Negative impingement of the shoulders.  She has a trace mild tremor.  Normal heel-toe gait.  Abdomen is soft.  No audible wheezing.  Lungs are clear.   IMAGING:  Her MRI scan shows a broad-based disk osteophyte complex with cervical stenosis which is moderate to severe with mild mass cord effect, no cord abnormality.  There is severe left foraminal stenosis, moderate on the right.  Other levels show mild changes without compression.   ASSESSMENT:  Single-level cervical spondylosis with stenosis.   PLAN:  We discussed options.  She would like to proceed with operative intervention.  The plan would be a single-level cervical fusion.  Overnight stay in the hospital.  Allograft and plate.  We discussed risks of surgery including dysphasia, dysphonia, pseudarthrosis, reoperation.  Risks of surgery discussed.  She is seeing Dr. Hamilton Capri who is from [place] but comes up to [facility] for a local clinic here frequently.  She will need cardiology clearance.  All questions answered. Sharese Manrique M 09/02/2015, 4:48 PM

## 2015-09-03 ENCOUNTER — Encounter (HOSPITAL_COMMUNITY): Payer: Self-pay | Admitting: *Deleted

## 2015-09-03 ENCOUNTER — Inpatient Hospital Stay (HOSPITAL_COMMUNITY): Payer: Medicare Other | Admitting: Anesthesiology

## 2015-09-03 ENCOUNTER — Observation Stay (HOSPITAL_COMMUNITY)
Admission: RE | Admit: 2015-09-03 | Discharge: 2015-09-04 | Disposition: A | Discharge status: Home / Self Care | Payer: Medicare Other | Source: Ambulatory Visit | Attending: Orthopaedic Surgery | Admitting: Orthopaedic Surgery

## 2015-09-03 ENCOUNTER — Inpatient Hospital Stay (HOSPITAL_COMMUNITY): Payer: Medicare Other

## 2015-09-03 ENCOUNTER — Encounter (HOSPITAL_COMMUNITY)
Admission: RE | Disposition: A | Discharge status: Home / Self Care | Payer: Self-pay | Source: Ambulatory Visit | Attending: Orthopaedic Surgery

## 2015-09-03 ENCOUNTER — Inpatient Hospital Stay (HOSPITAL_COMMUNITY): Payer: Medicare Other | Admitting: Vascular Surgery

## 2015-09-03 DIAGNOSIS — E119 Type 2 diabetes mellitus without complications: Secondary | ICD-10-CM | POA: Diagnosis not present

## 2015-09-03 DIAGNOSIS — E785 Hyperlipidemia, unspecified: Secondary | ICD-10-CM | POA: Insufficient documentation

## 2015-09-03 DIAGNOSIS — M47812 Spondylosis without myelopathy or radiculopathy, cervical region: Secondary | ICD-10-CM | POA: Diagnosis present

## 2015-09-03 DIAGNOSIS — F329 Major depressive disorder, single episode, unspecified: Secondary | ICD-10-CM | POA: Insufficient documentation

## 2015-09-03 DIAGNOSIS — Z981 Arthrodesis status: Secondary | ICD-10-CM

## 2015-09-03 DIAGNOSIS — I2510 Atherosclerotic heart disease of native coronary artery without angina pectoris: Secondary | ICD-10-CM | POA: Diagnosis not present

## 2015-09-03 DIAGNOSIS — Z951 Presence of aortocoronary bypass graft: Secondary | ICD-10-CM | POA: Diagnosis not present

## 2015-09-03 DIAGNOSIS — Z419 Encounter for procedure for purposes other than remedying health state, unspecified: Secondary | ICD-10-CM

## 2015-09-03 DIAGNOSIS — I10 Essential (primary) hypertension: Secondary | ICD-10-CM | POA: Insufficient documentation

## 2015-09-03 HISTORY — DX: Low back pain: M54.5

## 2015-09-03 HISTORY — DX: Type 2 diabetes mellitus without complications: E11.9

## 2015-09-03 HISTORY — PX: ANTERIOR CERVICAL DECOMP/DISCECTOMY FUSION: SHX1161

## 2015-09-03 HISTORY — DX: Migraine, unspecified, not intractable, without status migrainosus: G43.909

## 2015-09-03 HISTORY — DX: Headache, unspecified: R51.9

## 2015-09-03 HISTORY — DX: Headache: R51

## 2015-09-03 HISTORY — DX: Other chronic pain: G89.29

## 2015-09-03 HISTORY — DX: Cardiac murmur, unspecified: R01.1

## 2015-09-03 HISTORY — DX: Low back pain, unspecified: M54.50

## 2015-09-03 LAB — HEPATIC FUNCTION PANEL
ALK PHOS: 59 U/L (ref 38–126)
ALT: 71 U/L — ABNORMAL HIGH (ref 14–54)
AST: 58 U/L — ABNORMAL HIGH (ref 15–41)
Albumin: 4.1 g/dL (ref 3.5–5.0)
BILIRUBIN DIRECT: 0.2 mg/dL (ref 0.1–0.5)
BILIRUBIN INDIRECT: 0.6 mg/dL (ref 0.3–0.9)
TOTAL PROTEIN: 7.6 g/dL (ref 6.5–8.1)
Total Bilirubin: 0.8 mg/dL (ref 0.3–1.2)

## 2015-09-03 LAB — APTT: APTT: 28 s (ref 24–37)

## 2015-09-03 LAB — GLUCOSE, CAPILLARY
GLUCOSE-CAPILLARY: 144 mg/dL — AB (ref 65–99)
Glucose-Capillary: 85 mg/dL (ref 65–99)

## 2015-09-03 SURGERY — ANTERIOR CERVICAL DECOMPRESSION/DISCECTOMY FUSION 1 LEVEL
Anesthesia: General | Site: Spine Cervical | Laterality: Bilateral

## 2015-09-03 MED ORDER — CEFAZOLIN SODIUM 1-5 GM-% IV SOLN
1.0000 g | Freq: Three times a day (TID) | INTRAVENOUS | Status: AC
  Administered 2015-09-03 – 2015-09-04 (×2): 1 g via INTRAVENOUS
  Filled 2015-09-03 (×2): qty 50

## 2015-09-03 MED ORDER — FENTANYL CITRATE (PF) 250 MCG/5ML IJ SOLN
INTRAMUSCULAR | Status: AC
  Filled 2015-09-03: qty 5

## 2015-09-03 MED ORDER — DOCUSATE SODIUM 100 MG PO CAPS
100.0000 mg | ORAL_CAPSULE | Freq: Two times a day (BID) | ORAL | Status: DC
  Administered 2015-09-03 – 2015-09-04 (×2): 100 mg via ORAL
  Filled 2015-09-03 (×2): qty 1

## 2015-09-03 MED ORDER — ACETAMINOPHEN 325 MG PO TABS: 650.0000 mg | ORAL_TABLET | ORAL | Status: DC | PRN

## 2015-09-03 MED ORDER — METHOCARBAMOL 500 MG PO TABS
500.0000 mg | ORAL_TABLET | Freq: Four times a day (QID) | ORAL | Status: DC | PRN
Start: 2015-09-03 — End: 2015-09-04
  Administered 2015-09-04: 500 mg via ORAL
  Filled 2015-09-03 (×2): qty 1

## 2015-09-03 MED ORDER — CEFAZOLIN SODIUM-DEXTROSE 2-3 GM-% IV SOLR
INTRAVENOUS | Status: AC
  Filled 2015-09-03: qty 50

## 2015-09-03 MED ORDER — LIDOCAINE HCL (CARDIAC) 20 MG/ML IV SOLN
INTRAVENOUS | Status: DC | PRN
  Administered 2015-09-03: 100 mg via INTRAVENOUS

## 2015-09-03 MED ORDER — BUPIVACAINE-EPINEPHRINE 0.5% -1:200000 IJ SOLN
INTRAMUSCULAR | Status: DC | PRN
  Administered 2015-09-03: 6 mL

## 2015-09-03 MED ORDER — MENTHOL 3 MG MT LOZG: 1.0000 | LOZENGE | OROMUCOSAL | Status: DC | PRN

## 2015-09-03 MED ORDER — SODIUM CHLORIDE 0.9 % IJ SOLN
INTRAMUSCULAR | Status: AC
  Filled 2015-09-03: qty 20

## 2015-09-03 MED ORDER — PAROXETINE HCL 20 MG PO TABS
40.0000 mg | ORAL_TABLET | Freq: Every day | ORAL | Status: DC
  Administered 2015-09-03: 40 mg via ORAL
  Filled 2015-09-03: qty 2

## 2015-09-03 MED ORDER — DEXTROSE 5 % IV SOLN
500.0000 mg | Freq: Four times a day (QID) | INTRAVENOUS | Status: DC | PRN
  Administered 2015-09-03: 500 mg via INTRAVENOUS
  Filled 2015-09-03 (×2): qty 5

## 2015-09-03 MED ORDER — HYDRALAZINE HCL 20 MG/ML IJ SOLN
INTRAMUSCULAR | Status: AC
  Filled 2015-09-03: qty 1

## 2015-09-03 MED ORDER — ACETAMINOPHEN 650 MG RE SUPP: 650.0000 mg | RECTAL | Status: DC | PRN

## 2015-09-03 MED ORDER — THROMBIN 5000 UNITS EX SOLR
CUTANEOUS | Status: AC
  Filled 2015-09-03: qty 5000

## 2015-09-03 MED ORDER — POTASSIUM CHLORIDE IN NACL 20-0.45 MEQ/L-% IV SOLN
INTRAVENOUS | Status: DC
  Filled 2015-09-03 (×3): qty 1000

## 2015-09-03 MED ORDER — CEFAZOLIN SODIUM-DEXTROSE 2-3 GM-% IV SOLR: 2.0000 g | INTRAVENOUS | Status: DC

## 2015-09-03 MED ORDER — ROCURONIUM BROMIDE 50 MG/5ML IV SOLN
INTRAVENOUS | Status: AC
  Filled 2015-09-03: qty 1

## 2015-09-03 MED ORDER — 0.9 % SODIUM CHLORIDE (POUR BTL) OPTIME
TOPICAL | Status: DC | PRN
  Administered 2015-09-03: 1000 mL

## 2015-09-03 MED ORDER — HYDROMORPHONE HCL 1 MG/ML IJ SOLN
INTRAMUSCULAR | Status: AC
  Filled 2015-09-03: qty 1

## 2015-09-03 MED ORDER — ROCURONIUM BROMIDE 100 MG/10ML IV SOLN
INTRAVENOUS | Status: DC | PRN
Start: 2015-09-03 — End: 2015-09-03
  Administered 2015-09-03: 50 mg via INTRAVENOUS

## 2015-09-03 MED ORDER — ACETAMINOPHEN 10 MG/ML IV SOLN: 1000.0000 mg | Freq: Once | INTRAVENOUS | Status: DC | PRN

## 2015-09-03 MED ORDER — PHENYLEPHRINE HCL 10 MG/ML IJ SOLN
INTRAMUSCULAR | Status: DC | PRN
  Administered 2015-09-03 (×4): 80 ug via INTRAVENOUS

## 2015-09-03 MED ORDER — PANTOPRAZOLE SODIUM 40 MG PO TBEC
40.0000 mg | DELAYED_RELEASE_TABLET | Freq: Every day | ORAL | Status: DC
  Administered 2015-09-04: 40 mg via ORAL
  Filled 2015-09-03: qty 1

## 2015-09-03 MED ORDER — ONDANSETRON HCL 4 MG/2ML IJ SOLN
INTRAMUSCULAR | Status: DC | PRN
  Administered 2015-09-03: 4 mg via INTRAVENOUS

## 2015-09-03 MED ORDER — OXYCODONE-ACETAMINOPHEN 5-325 MG PO TABS
1.0000 | ORAL_TABLET | Freq: Four times a day (QID) | ORAL | Status: DC | PRN
  Administered 2015-09-03 – 2015-09-04 (×2): 1 via ORAL
  Filled 2015-09-03 (×2): qty 1

## 2015-09-03 MED ORDER — GLIPIZIDE 5 MG PO TABS
5.0000 mg | ORAL_TABLET | Freq: Every day | ORAL | Status: DC
  Administered 2015-09-04: 5 mg via ORAL
  Filled 2015-09-03: qty 1

## 2015-09-03 MED ORDER — ONDANSETRON HCL 4 MG/2ML IJ SOLN: 4.0000 mg | INTRAMUSCULAR | Status: DC | PRN

## 2015-09-03 MED ORDER — LIDOCAINE HCL (CARDIAC) 20 MG/ML IV SOLN
INTRAVENOUS | Status: AC
  Filled 2015-09-03: qty 5

## 2015-09-03 MED ORDER — HEMOSTATIC AGENTS (NO CHARGE) OPTIME
TOPICAL | Status: DC | PRN
  Administered 2015-09-03: 1 via TOPICAL

## 2015-09-03 MED ORDER — NEOSTIGMINE METHYLSULFATE 10 MG/10ML IV SOLN
INTRAVENOUS | Status: DC | PRN
  Administered 2015-09-03: 4 mg via INTRAVENOUS

## 2015-09-03 MED ORDER — LACTATED RINGERS IV SOLN
INTRAVENOUS | Status: DC
  Administered 2015-09-03 (×2): via INTRAVENOUS

## 2015-09-03 MED ORDER — EPHEDRINE SULFATE 50 MG/ML IJ SOLN
INTRAMUSCULAR | Status: AC
  Filled 2015-09-03: qty 3

## 2015-09-03 MED ORDER — HYDROMORPHONE HCL 1 MG/ML IJ SOLN
0.2500 mg | INTRAMUSCULAR | Status: DC | PRN
  Administered 2015-09-03 (×6): 0.5 mg via INTRAVENOUS

## 2015-09-03 MED ORDER — ATORVASTATIN CALCIUM 40 MG PO TABS
40.0000 mg | ORAL_TABLET | Freq: Every day | ORAL | Status: DC
Start: 2015-09-03 — End: 2015-09-04
  Administered 2015-09-03: 40 mg via ORAL
  Filled 2015-09-03: qty 1

## 2015-09-03 MED ORDER — OXYCODONE-ACETAMINOPHEN 5-325 MG PO TABS
ORAL_TABLET | ORAL | Status: AC
Start: 2015-09-03 — End: 2015-09-04
  Filled 2015-09-03: qty 1

## 2015-09-03 MED ORDER — NEOSTIGMINE METHYLSULFATE 10 MG/10ML IV SOLN
INTRAVENOUS | Status: AC
  Filled 2015-09-03: qty 1

## 2015-09-03 MED ORDER — SODIUM CHLORIDE 0.9 % IV SOLN: 250.0000 mL | INTRAVENOUS | Status: DC

## 2015-09-03 MED ORDER — ONDANSETRON HCL 4 MG/2ML IJ SOLN
INTRAMUSCULAR | Status: AC
  Filled 2015-09-03: qty 2

## 2015-09-03 MED ORDER — PROPOFOL 10 MG/ML IV BOLUS
INTRAVENOUS | Status: AC
  Filled 2015-09-03: qty 20

## 2015-09-03 MED ORDER — PROPRANOLOL HCL 20 MG PO TABS
20.0000 mg | ORAL_TABLET | Freq: Two times a day (BID) | ORAL | Status: DC
  Administered 2015-09-03 – 2015-09-04 (×2): 20 mg via ORAL
  Filled 2015-09-03 (×3): qty 1

## 2015-09-03 MED ORDER — HYDROMORPHONE HCL 1 MG/ML IJ SOLN
0.5000 mg | INTRAMUSCULAR | Status: DC | PRN
  Administered 2015-09-04 (×3): 0.5 mg via INTRAVENOUS
  Filled 2015-09-03 (×3): qty 1

## 2015-09-03 MED ORDER — METFORMIN HCL 500 MG PO TABS
500.0000 mg | ORAL_TABLET | Freq: Two times a day (BID) | ORAL | Status: DC
  Administered 2015-09-04: 500 mg via ORAL
  Filled 2015-09-03: qty 1

## 2015-09-03 MED ORDER — PROPOFOL 10 MG/ML IV BOLUS
INTRAVENOUS | Status: DC | PRN
  Administered 2015-09-03: 150 mg via INTRAVENOUS

## 2015-09-03 MED ORDER — EPHEDRINE SULFATE 50 MG/ML IJ SOLN
INTRAMUSCULAR | Status: DC | PRN
  Administered 2015-09-03 (×3): 10 mg via INTRAVENOUS

## 2015-09-03 MED ORDER — FENTANYL CITRATE (PF) 100 MCG/2ML IJ SOLN
INTRAMUSCULAR | Status: DC | PRN
  Administered 2015-09-03 (×2): 50 ug via INTRAVENOUS
  Administered 2015-09-03: 150 ug via INTRAVENOUS

## 2015-09-03 MED ORDER — BUPIVACAINE-EPINEPHRINE (PF) 0.5% -1:200000 IJ SOLN
INTRAMUSCULAR | Status: AC
  Filled 2015-09-03: qty 30

## 2015-09-03 MED ORDER — PHENOL 1.4 % MT LIQD
1.0000 | OROMUCOSAL | Status: DC | PRN
Start: 2015-09-03 — End: 2015-09-04

## 2015-09-03 MED ORDER — KETOROLAC TROMETHAMINE 30 MG/ML IJ SOLN
30.0000 mg | Freq: Once | INTRAMUSCULAR | Status: AC
  Administered 2015-09-03: 30 mg via INTRAVENOUS

## 2015-09-03 MED ORDER — SODIUM CHLORIDE 0.9 % IJ SOLN
3.0000 mL | INTRAMUSCULAR | Status: DC | PRN
Start: 2015-09-03 — End: 2015-09-04

## 2015-09-03 MED ORDER — HYDRALAZINE HCL 20 MG/ML IJ SOLN
5.0000 mg | Freq: Four times a day (QID) | INTRAMUSCULAR | Status: DC | PRN
Start: 2015-09-03 — End: 2015-09-03
  Administered 2015-09-03: 5 mg via INTRAVENOUS

## 2015-09-03 MED ORDER — GLYCOPYRROLATE 0.2 MG/ML IJ SOLN
INTRAMUSCULAR | Status: DC | PRN
  Administered 2015-09-03: 0.6 mg via INTRAVENOUS

## 2015-09-03 MED ORDER — SODIUM CHLORIDE 0.9 % IJ SOLN
3.0000 mL | Freq: Two times a day (BID) | INTRAMUSCULAR | Status: DC
  Administered 2015-09-03: 3 mL via INTRAVENOUS

## 2015-09-03 MED ORDER — KETOROLAC TROMETHAMINE 30 MG/ML IJ SOLN
INTRAMUSCULAR | Status: AC
  Filled 2015-09-03: qty 1

## 2015-09-03 SURGICAL SUPPLY — 52 items
BENZOIN TINCTURE PRP APPL 2/3 (GAUZE/BANDAGES/DRESSINGS) ×3 IMPLANT
BIT DRILL SKYLINE 12MM (BIT) ×1 IMPLANT
BLADE SURG ROTATE 9660 (MISCELLANEOUS) IMPLANT
BONE CERV LORDOTIC 14.5X12X8 (Bone Implant) ×3 IMPLANT
BUR ROUND FLUTED 4 SOFT TCH (BURR) ×2 IMPLANT
BUR ROUND FLUTED 4MM SOFT TCH (BURR) ×1
CANISTER SUCTION WELLS/JOHNSON (MISCELLANEOUS) ×3 IMPLANT
CLOSURE WOUND 1/2 X4 (GAUZE/BANDAGES/DRESSINGS) ×1
COLLAR CERV LO CONTOUR FIRM DE (SOFTGOODS) ×3 IMPLANT
COVER MAYO STAND STRL (DRAPES) ×6 IMPLANT
COVER SURGICAL LIGHT HANDLE (MISCELLANEOUS) ×3 IMPLANT
CRADLE DONUT ADULT HEAD (MISCELLANEOUS) ×3 IMPLANT
DRAPE C-ARM 42X72 X-RAY (DRAPES) ×3 IMPLANT
DRAPE INCISE IOBAN 66X45 STRL (DRAPES) ×6 IMPLANT
DRAPE MICROSCOPE LEICA (MISCELLANEOUS) ×3 IMPLANT
DRAPE PROXIMA HALF (DRAPES) ×9 IMPLANT
DRILL BIT SKYLINE 12MM (BIT) ×2
DURAPREP 6ML APPLICATOR 50/CS (WOUND CARE) ×3 IMPLANT
ELECT COATED BLADE 2.86 ST (ELECTRODE) ×3 IMPLANT
ELECT REM PT RETURN 9FT ADLT (ELECTROSURGICAL) ×3
ELECTRODE REM PT RTRN 9FT ADLT (ELECTROSURGICAL) ×1 IMPLANT
EVACUATOR 1/8 PVC DRAIN (DRAIN) ×3 IMPLANT
GAUZE SPONGE 4X4 12PLY STRL (GAUZE/BANDAGES/DRESSINGS) ×3 IMPLANT
GAUZE XEROFORM 1X8 LF (GAUZE/BANDAGES/DRESSINGS) ×3 IMPLANT
GLOVE BIOGEL PI IND STRL 8 (GLOVE) ×2 IMPLANT
GLOVE BIOGEL PI INDICATOR 8 (GLOVE) ×4
GLOVE ORTHO TXT STRL SZ7.5 (GLOVE) ×6 IMPLANT
GOWN STRL REUS W/ TWL LRG LVL3 (GOWN DISPOSABLE) ×1 IMPLANT
GOWN STRL REUS W/ TWL XL LVL3 (GOWN DISPOSABLE) ×1 IMPLANT
GOWN STRL REUS W/TWL 2XL LVL3 (GOWN DISPOSABLE) ×3 IMPLANT
GOWN STRL REUS W/TWL LRG LVL3 (GOWN DISPOSABLE) ×2
GOWN STRL REUS W/TWL XL LVL3 (GOWN DISPOSABLE) ×2
HEAD HALTER (SOFTGOODS) ×3 IMPLANT
KIT BASIN OR (CUSTOM PROCEDURE TRAY) ×3 IMPLANT
KIT ROOM TURNOVER OR (KITS) ×3 IMPLANT
MANIFOLD NEPTUNE II (INSTRUMENTS) IMPLANT
NEEDLE 25GX 5/8IN NON SAFETY (NEEDLE) ×3 IMPLANT
NS IRRIG 1000ML POUR BTL (IV SOLUTION) ×3 IMPLANT
PACK ORTHO CERVICAL (CUSTOM PROCEDURE TRAY) ×3 IMPLANT
PAD ARMBOARD 7.5X6 YLW CONV (MISCELLANEOUS) ×6 IMPLANT
PATTIES SURGICAL .5 X.5 (GAUZE/BANDAGES/DRESSINGS) IMPLANT
PIN TEMP SKYLINE THREADED (PIN) ×3 IMPLANT
PLATE ONE LEVEL SKYLINE 14MM (Plate) ×3 IMPLANT
SCREW VARIABLE SELF TAP 12MM (Screw) ×12 IMPLANT
SPOGE SURGIFLO 8M (HEMOSTASIS) ×2
SPONGE SURGIFLO 8M (HEMOSTASIS) ×1 IMPLANT
STRIP CLOSURE SKIN 1/2X4 (GAUZE/BANDAGES/DRESSINGS) ×2 IMPLANT
SUT BONE WAX W31G (SUTURE) ×3 IMPLANT
SUT VIC AB 3-0 X1 27 (SUTURE) ×3 IMPLANT
SYR BULB 3OZ (MISCELLANEOUS) ×3 IMPLANT
TOWEL OR 17X24 6PK STRL BLUE (TOWEL DISPOSABLE) ×3 IMPLANT
TOWEL OR 17X26 10 PK STRL BLUE (TOWEL DISPOSABLE) ×3 IMPLANT

## 2015-09-03 NOTE — Interval H&P Note (Signed)
History and Physical Interval Note:  09/03/2015 12:01 PM  Alexa Mcpherson  has presented today for surgery, with the diagnosis of C5-6 Stenosis, Left Foraminal Stenosis  The various methods of treatment have been discussed with the patient and family. After consideration of risks, benefits and other options for treatment, the patient has consented to  Procedure(s): C5-6 Anterior Cervical Discectomy and Fusion, Allograft, Plate  (N/A) as a surgical intervention .  The patient's history has been reviewed, patient examined, no change in status, stable for surgery.  I have reviewed the patient's chart and labs.  Questions were answered to the patient's satisfaction.     [name]

## 2015-09-03 NOTE — Transfer of Care (Signed)
Immediate Anesthesia Transfer of Care Note  Patient: Alexa Mcpherson  Procedure(s) Performed: Procedure(s): C5-6 Anterior Cervical Discectomy and Fusion, Allograft, Plate  (Bilateral)  Patient Location: PACU  Anesthesia Type:General  Level of Consciousness: awake, alert  and oriented  Airway & Oxygen Therapy: Patient Spontanous Breathing and Patient connected to nasal cannula oxygen  Post-op Assessment: Report given to RN, Post -op Vital signs reviewed and stable and Patient moving all extremities X 4  Post vital signs: Reviewed and stable  Last Vitals:  Filed Vitals:   09/03/15 1105  BP: 189/45  Pulse: 65  Temp: 36.2 C  Resp: 18    Complications: No apparent anesthesia complications

## 2015-09-03 NOTE — Progress Notes (Signed)
Report given to celine harris rn as cargiver

## 2015-09-03 NOTE — Anesthesia Preprocedure Evaluation (Signed)
Anesthesia Evaluation  Patient identified by MRN, date of birth, ID band Patient awake    Reviewed: Allergy & Precautions, H&P , NPO status , Patient's Chart, lab work & pertinent test results, reviewed documented beta blocker date and time   Airway Mallampati: II  TM Distance: >3 FB Neck ROM: Full    Dental no notable dental hx. (+) Upper Dentures, Partial Lower, Dental Advisory Given   Pulmonary neg pulmonary ROS,    Pulmonary exam normal breath sounds clear to auscultation       Cardiovascular hypertension, Pt. on medications and Pt. on home beta blockers + CAD and + Peripheral Vascular Disease   Rhythm:Regular Rate:Normal     Neuro/Psych  Headaches, Depression negative neurological ROS     GI/Hepatic Neg liver ROS, GERD  Medicated and Controlled,  Endo/Other  diabetes, Type 2, Oral Hypoglycemic Agents  Renal/GU negative Renal ROS  negative genitourinary   Musculoskeletal   Abdominal   Peds  Hematology negative hematology ROS (+)   Anesthesia Other Findings   Reproductive/Obstetrics negative OB ROS                             Anesthesia Physical Anesthesia Plan  ASA: III  Anesthesia Plan: General   Post-op Pain Management:    Induction: Intravenous  Airway Management Planned: Oral ETT  Additional Equipment:   Intra-op Plan:   Post-operative Plan: Extubation in OR  Informed Consent: I have reviewed the patients History and Physical, chart, labs and discussed the procedure including the risks, benefits and alternatives for the proposed anesthesia with the patient or authorized representative who has indicated his/her understanding and acceptance.   Dental advisory given  Plan Discussed with: CRNA  Anesthesia Plan Comments:         Anesthesia Quick Evaluation

## 2015-09-03 NOTE — Brief Op Note (Signed)
09/03/2015  2:09 PM  PATIENT:  Alexa Mcpherson  74 y.o. female  PRE-OPERATIVE DIAGNOSIS:  C5-6 Stenosis, Left Foraminal Stenosis  POST-OPERATIVE DIAGNOSIS:  C5-6 Stenosis, Left Foraminal Stenosis  PROCEDURE:  Procedure(s): C5-6 Anterior Cervical Discectomy and Fusion, Allograft, Plate  (Bilateral)  SURGEON:  Surgeon(s) and Role:    * Marybelle Killings, MD - Primary  PHYSICIAN ASSISTANT: Benjiman Core     ANESTHESIA:   general  EBL:  Total I/O In: 1000 [I.V.:1000] Out: -   BLOOD ADMINISTERED:none  DRAINS: hemovac   SPECIMEN:  No Specimen  DISPOSITION OF SPECIMEN:  N/A  COUNTS:  YES  TOURNIQUET:  * No tourniquets in log *    PATIENT DISPOSITION:  PACU - hemodynamically stable.

## 2015-09-03 NOTE — Progress Notes (Signed)
pts bp 186/88 consulted dr germeroth orders obtaind and carried out

## 2015-09-03 NOTE — Anesthesia Procedure Notes (Signed)
Procedure Name: Intubation Date/Time: 09/03/2015 12:31 PM Performed by: Trixie Deis A Pre-anesthesia Checklist: Patient identified, Timeout performed, Emergency Drugs available, Suction available and Patient being monitored Patient Re-evaluated:Patient Re-evaluated prior to inductionOxygen Delivery Method: Circle system utilized Preoxygenation: Pre-oxygenation with 100% oxygen Intubation Type: IV induction Ventilation: Mask ventilation without difficulty Laryngoscope Size: Mac and 4 Grade View: Grade I Tube type: Oral Tube size: 7.0 mm Number of attempts: 1 Airway Equipment and Method: Stylet Secured at: 21 cm Tube secured with: Tape Dental Injury: Teeth and Oropharynx as per pre-operative assessment

## 2015-09-04 ENCOUNTER — Encounter (HOSPITAL_COMMUNITY): Payer: Self-pay | Admitting: Orthopaedic Surgery

## 2015-09-04 DIAGNOSIS — M47812 Spondylosis without myelopathy or radiculopathy, cervical region: Secondary | ICD-10-CM | POA: Diagnosis not present

## 2015-09-04 LAB — GLUCOSE, CAPILLARY
GLUCOSE-CAPILLARY: 127 mg/dL — AB (ref 65–99)
GLUCOSE-CAPILLARY: 130 mg/dL — AB (ref 65–99)

## 2015-09-04 MED ORDER — OXYCODONE-ACETAMINOPHEN 5-325 MG PO TABS: 1.0000 | ORAL_TABLET | Freq: Four times a day (QID) | ORAL | Status: DC | PRN

## 2015-09-04 NOTE — Progress Notes (Signed)
Patient is discharged form room 5C13 at this time. Alert and in stable condition. IV site d/c'd. Instructions read to patient and understanding verbalized. Left unit via wheelchair with husband and all belongings at side.

## 2015-09-04 NOTE — Op Note (Signed)
NAMECHARIZMA, GARDINER              ACCOUNT NO.:  [id number]  MEDICAL RECORD NO.:  [id number]  LOCATION:  61C13C                        FACILITY:  [facility]  PHYSICIAN:  Carianna Lague C. Lorin Mercy, M.D.    DATE OF BIRTH:  Apr 10, 1941  DATE OF PROCEDURE:  09/03/2015 DATE OF DISCHARGE:                              OPERATIVE REPORT   PREOPERATIVE DIAGNOSIS:  Cervical spondylosis, C5-6.  POSTOPERATIVE DIAGNOSIS:  Cervical spondylosis, C5-6.  PROCEDURE:  C5-6 anterior cervical diskectomy and fusion, allograft and plate.  SURGEON:  Izaan Kingbird C. Lorin Mercy, MD  ASSISTANT:  Mack Guise, PA-C, medically necessary and present for the entire procedure.  ESTIMATED BLOOD LOSS:  Minimal.  ANESTHESIA:  General plus 6 mL Marcaine local.  DRAINS:  1 Hemovac, neck.  IMPLANTS:  DePuy Skyline 14 mm plate, 12 mm screws x4, 8 mm 5 degree lordotic cortical cancellous allograft.  PROCEDURE IN DETAIL:  After standard prepping and draping, with intubation, preoperative Ancef prophylaxis, time-out procedure, arms at the side with the yellow foam pads over the ulnar nerve, careful padding and positioning.  Head halter traction was applied without weight.  Neck was prepped with DuraPrep.  Area was squared with towels, 0.5 of a Betadine Steri-Drape was applied, after sterile skin marker, and a prominent fold marking from the midline to the left.  Sterile Mayo stand at the head.  Thyroid sheets, drapes were applied.  Time-out procedure completed.  Incision started at the midline extending to the left in line with a prominent skin fold.  Platysma was divided in line with the fibers.  Blunt dissection down over the top of the omohyoid to the longus coli muscle splitting the fibers with blunt dissection.  One tiny superficial vein medially was coagulated.  More prominent vein laterally was saved.  Large spur at C5-6 was palpated.  Cloward handheld retractors were placed right and left.  The disk was marked with a short 25 needle with  a straight clamp confirmed with C-arm lateral image which was visualized showing the prominent spurs of C5-C6.  Disk was marked, cutting a large chunk out of the middle of it.  Self-retaining teeth blades were placed right and left, smooth blades proximal and distal with the Cloward retractors.  Diskectomy was performed.  Operative microscope was draped, brought in, posterior longitudinal ligament was taken down.  There was kissing osteophytes with 0.5 mm gap which was tight and mild shingling.  Once spurs were carefully picked and removed off the dura, uncovertebral joints were stripped, right and left. Decompression was performed.  Once the dura was completely decompressed, right and left, trial sizers showed 8 mm, gave a nice snug fit, 7 was loose.  With head halter traction pulled by the CRNA, the graft was marked anteriorly, was countersunk 2 mm.  Plate was selected, size checked on fluoroscopy.  Prong was placed and plate was adjusted and 4 screws were placed, checked under AP and lateral fluoro with good position and then screws were locked down.  Operative field was dry. Some Surgiflo had been placed, completely removed prior to placing the graft since there was a little bit of endplate bleeding.  Slightly more and plate was removed cephalad and caudad in  order to given of decompression to remove the posterior spurs which were contributing to the spinal stenosis.  Operative field was dry.  Hemovac was placed in and out technique on the left side in line with the skin fibers. Platysma was closed with 3-0 Vicryl, 4-0 Vicryl subcuticular closure. Tincture of benzoin, Steri-Strips, Marcaine infiltration, 4x4s, soft collar dressing and tape for the drain was applied.  Patient tolerated the procedure well, was transferred to recovery room in stable condition.     Krystyna Cleckley C. Lorin Mercy, M.D.     MCY/MEDQ  D:  09/03/2015  T:  09/04/2015  Job:  [id number]

## 2015-09-04 NOTE — Progress Notes (Signed)
Subjective: Doing well.  Pain controlled.    Objective: Vital signs in last 24 hours: Temp:  [97.1 F (36.2 C)-99.1 F (37.3 C)] 98.1 F (36.7 C) (09/08 0549) Pulse Rate:  [65-82] 70 (09/08 0549) Resp:  [8-18] 16 (09/08 0549) BP: (127-195)/(45-91) 127/62 mmHg (09/08 0549) SpO2:  [91 %-99 %] 92 % (09/08 0549) Weight:  [71.501 kg (157 lb 10.1 oz)] 71.501 kg (157 lb 10.1 oz) (09/07 1105)  Intake/Output from previous day: 09/07 0701 - 09/08 0700 In: 1655 [I.V.:1600; IV Piggyback:55] Out: 75 [Urine:75] Intake/Output this shift:    No results for input(s): HGB in the last 72 hours. No results for input(s): WBC, RBC, HCT, PLT in the last 72 hours. No results for input(s): NA, K, CL, CO2, BUN, CREATININE, GLUCOSE, CALCIUM in the last 72 hours. No results for input(s): LABPT, INR in the last 72 hours.  Exam:  Alert and oriented.  Drain removed by Dr Lorin Mercy.  NVI.   Assessment/Plan: D/c home today.  F/u in office one week postop.  Script on chart for percocet.     Shandee Jergens M 09/04/2015, 8:30 AM

## 2015-09-04 NOTE — Discharge Summary (Signed)
Patient ID: Alexa Mcpherson MRN: [id number] DOB/AGE: [birth year] 73 y.o.  Admit date: 09/03/2015 Discharge date: 09/04/2015  Admission Diagnoses:  Active Problems:   S/P cervical spinal fusion   Discharge Diagnoses:  Active Problems:   S/P cervical spinal fusion  status post Procedure(s): C5-6 Anterior Cervical Discectomy and Fusion, Allograft, Plate   Past Medical History  Diagnosis Date  . GERD (gastroesophageal reflux disease)     takes Omeprazole daily  . Hyperlipidemia     takes Simvastatin daily  . Depression     takes Paxil daily  . Coronary artery disease   . Dizziness     r/t neck issues  . Tingling     weakness and numbness in left arm  . Neck pain   . Shakes     takes Propranolol daily  . Carotid stenosis     left ICA 60% 04/2015 (1 yr f/u rec, Dr. Glenford Bayley)  . Heart murmur   . Hypertension   . Pneumonia 1940's X 1  . Type II diabetes mellitus     takes Glipizide and Metformin;Fasting sugar 105-125  . Daily headache     "related to pinched nerve" (09/03/2015)  . Migraine     "stopped when I went thru the change"  . Chronic lower back pain     "frequency depends upon how active I am" (09/02/2015)    Surgeries: Procedure(s): C5-6 Anterior Cervical Discectomy and Fusion, Allograft, Plate  on 5/0/5397   Consultants:    Discharged Condition: Improved  Hospital Course: Alexa Mcpherson is an 74 y.o. female who was admitted 09/03/2015 for operative treatment of cervical stenosis. Patient failed conservative treatments (please see the history and physical for the specifics) and had severe unremitting pain that affects sleep, daily activities and work/hobbies. After pre-op clearance, the patient was taken to the operating room on 09/03/2015 and underwent  Procedure(s): C5-6 Anterior Cervical Discectomy and Fusion, Allograft, Plate .    Patient was given perioperative antibiotics: Anti-infectives    Start     Dose/Rate Route Frequency Ordered Stop   09/03/15  2000  ceFAZolin (ANCEF) IVPB 1 g/50 mL premix     1 g 100 mL/hr over 30 Minutes Intravenous Every 8 hours 09/03/15 1844 09/04/15 0424   09/03/15 1215  ceFAZolin (ANCEF) IVPB 2 g/50 mL premix  Status:  Discontinued     2 g 100 mL/hr over 30 Minutes Intravenous On call to O.R. 09/03/15 1103 09/03/15 1722   09/03/15 1108  ceFAZolin (ANCEF) 2-3 GM-% IVPB SOLR    Comments:  Sammuel Cooper   : cabinet override      09/03/15 1108 09/03/15 2314       Patient was given sequential compression devices and early ambulation to prevent DVT.   Patient benefited maximally from hospital stay and there were no complications. At the time of discharge, the patient was urinating/moving their bowels without difficulty, tolerating a regular diet, pain is controlled with oral pain medications and they have been cleared by PT/OT.   Recent vital signs: Patient Vitals for the past 24 hrs:  BP Temp Temp src Pulse Resp SpO2 Height Weight  09/04/15 0549 127/62 mmHg 98.1 F (36.7 C) Oral 70 16 92 % - -  09/04/15 0143 (!) 153/75 mmHg 98.2 F (36.8 C) Oral 67 16 92 % - -  09/03/15 2108 (!) 170/74 mmHg - - - - - - -  09/03/15 2104 (!) 179/70 mmHg 98.2 F (36.8 C) Oral 66 16 94 % - -  09/03/15 1726 (!) 145/56 mmHg 98.2 F (36.8 C) - 69 15 92 % - -  09/03/15 1710 - 97.9 F (36.6 C) - - - - - -  09/03/15 1706 - - - 70 17 91 % - -  09/03/15 1700 (!) 137/50 mmHg - - 70 13 93 % - -  09/03/15 1645 - - - 67 (!) 8 93 % - -  09/03/15 1639 - - - 68 10 97 % - -  09/03/15 1630 (!) 163/53 mmHg - - 71 14 95 % - -  09/03/15 1615 - - - 79 12 99 % - -  09/03/15 1600 - - - 78 18 99 % - -  09/03/15 1552 - - - 79 14 99 % - -  09/03/15 1545 - - - 80 11 98 % - -  09/03/15 1530 (!) 172/60 mmHg - - 80 12 98 % - -  09/03/15 1515 - - - 81 11 97 % - -  09/03/15 1500 (!) 186/67 mmHg - - 82 11 97 % - -  09/03/15 1445 (!) 187/68 mmHg - - 79 13 99 % - -  09/03/15 1430 (!) 195/80 mmHg - - 78 16 98 % - -  09/03/15 1415 (!) 165/91 mmHg - -  79 16 96 % - -  09/03/15 1411 - 99.1 F (37.3 C) - 81 16 94 % - -  09/03/15 1105 (!) 189/45 mmHg 97.1 F (36.2 C) Oral 65 18 98 % 5\' 4"  (1.626 m) 71.501 kg (157 lb 10.1 oz)     Recent laboratory studies: No results for input(s): WBC, HGB, HCT, PLT, NA, K, CL, CO2, BUN, CREATININE, GLUCOSE, INR, CALCIUM in the last 72 hours.  Invalid input(s): PT, 2   Discharge Medications:     Medication List    STOP taking these medications        aspirin 325 MG tablet     traMADol 50 MG tablet  Commonly known as:  ULTRAM      TAKE these medications        glipiZIDE 5 MG tablet  Commonly known as:  GLUCOTROL  Take 5 mg by mouth daily before breakfast.     glyBURIDE-metformin 5-500 MG per tablet  Commonly known as:  GLUCOVANCE  Take 1 tablet by mouth 3 (three) times daily with meals.     metFORMIN 500 MG tablet  Commonly known as:  GLUCOPHAGE  Take 500 mg by mouth 2 (two) times daily with a meal.     multivitamins ther. w/minerals Tabs tablet  Take 1 tablet by mouth every morning.     omeprazole 20 MG capsule  Commonly known as:  PRILOSEC  Take 40 mg by mouth daily.     oxyCODONE-acetaminophen 5-325 MG per tablet  Commonly known as:  PERCOCET/ROXICET  Take 1 tablet by mouth every 6 (six) hours as needed for moderate pain.     PARoxetine 40 MG tablet  Commonly known as:  PAXIL  Take 40 mg by mouth at bedtime.     propranolol 20 MG tablet  Commonly known as:  INDERAL  Take 20 mg by mouth 2 (two) times daily.     simvastatin 80 MG tablet  Commonly known as:  ZOCOR  Take 40 mg by mouth at bedtime.     valsartan 40 MG tablet  Commonly known as:  DIOVAN  Take 40 mg by mouth daily.     valsartan-hydrochlorothiazide 160-12.5 MG per tablet  Commonly known as:  DIOVAN-HCT  Take 1 tablet by mouth at bedtime.     VICTOZA 18 MG/3ML Soln injection  Generic drug:  Liraglutide  Inject 1.2 mg into the skin daily.        Diagnostic Studies: Dg Cervical Spine 2-3  Views  09/03/2015   CLINICAL DATA:  Patient status post C5-6 ACDF.  EXAM: DG C-ARM 61-120 MIN; CERVICAL SPINE - 2-3 VIEW  COMPARISON:  MRI C-spine 08/06/2015  FINDINGS: 2 intraoperative fluoroscopic images were presented. Anterior cervical spinal fusion hardware is demonstrated at the C5-6 level without evidence for acute osseous abnormality.  IMPRESSION: Patient status post C5-6 ACDF.   Electronically Signed   By: Lovey Newcomer M.D.   On: 09/03/2015 15:47   Dg C-arm 1-60 Min  09/03/2015   CLINICAL DATA:  Patient status post C5-6 ACDF.  EXAM: DG C-ARM 61-120 MIN; CERVICAL SPINE - 2-3 VIEW  COMPARISON:  MRI C-spine 08/06/2015  FINDINGS: 2 intraoperative fluoroscopic images were presented. Anterior cervical spinal fusion hardware is demonstrated at the C5-6 level without evidence for acute osseous abnormality.  IMPRESSION: Patient status post C5-6 ACDF.   Electronically Signed   By: Lovey Newcomer M.D.   On: 09/03/2015 15:47        Discharge Instructions    Call MD / Call 911    Complete by:  As directed   If you experience chest pain or shortness of breath, CALL 911 and be transported to the hospital emergency room.  If you develope a fever above 101 F, pus (white drainage) or increased drainage or redness at the wound, or calf pain, call your surgeon's office.     Constipation Prevention    Complete by:  As directed   Drink plenty of fluids.  Prune juice may be helpful.  You may use a stool softener, such as Colace (over the counter) 100 mg twice a day.  Use MiraLax (over the counter) for constipation as needed.     Diet - low sodium heart healthy    Complete by:  As directed      Discharge instructions    Complete by:  As directed   Ok to shower 5 days postop.  Do not apply any creams or ointments to incision.  Do not remove steri-strips.  Can use 4x4 gauze and tape for dressing changes.  No aggressive activity. Cervical collar must be on at all times except when showering.  Do not bend or turn neck.      Driving restrictions    Complete by:  As directed   No driving     Increase activity slowly as tolerated    Complete by:  As directed      Lifting restrictions    Complete by:  As directed   No lifting           Follow-up Information    Schedule an appointment as soon as possible for a visit with Marybelle Killings, MD.   Specialty:  Orthopedic Surgery   Why:  schedule return office visit one week postop   Contact information:   [place] [address] [phone]       Discharge Plan:  discharge to home  Disposition:     Signed: OWENS,JAMES M9/07/2015, 8:20 AM  [facility]

## 2015-09-05 NOTE — Anesthesia Postprocedure Evaluation (Signed)
  Anesthesia Post-op Note  Patient: Alexa Mcpherson  Procedure(s) Performed: Procedure(s): C5-6 Anterior Cervical Discectomy and Fusion, Allograft, Plate  (Bilateral)  Patient Location: PACU  Anesthesia Type:General  Level of Consciousness: awake and alert   Airway and Oxygen Therapy: Patient Spontanous Breathing  Post-op Pain: Controlled  Post-op Assessment: Post-op Vital signs reviewed, Patient's Cardiovascular Status Stable and Respiratory Function Stable  Post-op Vital Signs: Reviewed  Filed Vitals:   09/04/15 0947  BP: 144/56  Pulse: 72  Temp: 36.8 C  Resp: 20    Complications: No apparent anesthesia complications

## 2016-04-16 DIAGNOSIS — I679 Cerebrovascular disease, unspecified: Secondary | ICD-10-CM | POA: Insufficient documentation

## 2016-04-18 DIAGNOSIS — E875 Hyperkalemia: Secondary | ICD-10-CM | POA: Insufficient documentation

## 2016-04-18 DIAGNOSIS — D509 Iron deficiency anemia, unspecified: Secondary | ICD-10-CM | POA: Insufficient documentation

## 2016-08-09 DIAGNOSIS — I25810 Atherosclerosis of coronary artery bypass graft(s) without angina pectoris: Secondary | ICD-10-CM | POA: Diagnosis not present

## 2016-08-09 DIAGNOSIS — N3001 Acute cystitis with hematuria: Secondary | ICD-10-CM | POA: Diagnosis not present

## 2016-08-16 DIAGNOSIS — E119 Type 2 diabetes mellitus without complications: Secondary | ICD-10-CM | POA: Insufficient documentation

## 2016-08-16 DIAGNOSIS — I2510 Atherosclerotic heart disease of native coronary artery without angina pectoris: Secondary | ICD-10-CM | POA: Diagnosis not present

## 2016-08-16 DIAGNOSIS — I10 Essential (primary) hypertension: Secondary | ICD-10-CM | POA: Diagnosis not present

## 2016-08-16 DIAGNOSIS — E782 Mixed hyperlipidemia: Secondary | ICD-10-CM | POA: Insufficient documentation

## 2016-08-16 DIAGNOSIS — Z951 Presence of aortocoronary bypass graft: Secondary | ICD-10-CM | POA: Diagnosis not present

## 2016-08-16 DIAGNOSIS — I6529 Occlusion and stenosis of unspecified carotid artery: Secondary | ICD-10-CM | POA: Diagnosis not present

## 2016-08-19 DIAGNOSIS — Z7984 Long term (current) use of oral hypoglycemic drugs: Secondary | ICD-10-CM | POA: Diagnosis not present

## 2016-08-19 DIAGNOSIS — E119 Type 2 diabetes mellitus without complications: Secondary | ICD-10-CM | POA: Diagnosis not present

## 2016-08-19 DIAGNOSIS — M810 Age-related osteoporosis without current pathological fracture: Secondary | ICD-10-CM | POA: Diagnosis not present

## 2016-08-19 DIAGNOSIS — I10 Essential (primary) hypertension: Secondary | ICD-10-CM | POA: Diagnosis not present

## 2016-08-19 DIAGNOSIS — Z79899 Other long term (current) drug therapy: Secondary | ICD-10-CM | POA: Diagnosis not present

## 2016-08-19 DIAGNOSIS — Z780 Asymptomatic menopausal state: Secondary | ICD-10-CM | POA: Diagnosis not present

## 2016-08-19 DIAGNOSIS — R251 Tremor, unspecified: Secondary | ICD-10-CM | POA: Diagnosis not present

## 2016-08-19 DIAGNOSIS — E7800 Pure hypercholesterolemia, unspecified: Secondary | ICD-10-CM | POA: Diagnosis not present

## 2016-08-19 DIAGNOSIS — Z7982 Long term (current) use of aspirin: Secondary | ICD-10-CM | POA: Diagnosis not present

## 2016-08-31 DIAGNOSIS — I10 Essential (primary) hypertension: Secondary | ICD-10-CM | POA: Diagnosis not present

## 2016-08-31 DIAGNOSIS — K210 Gastro-esophageal reflux disease with esophagitis: Secondary | ICD-10-CM | POA: Diagnosis not present

## 2016-08-31 DIAGNOSIS — E1165 Type 2 diabetes mellitus with hyperglycemia: Secondary | ICD-10-CM | POA: Diagnosis not present

## 2016-09-06 DIAGNOSIS — H40003 Preglaucoma, unspecified, bilateral: Secondary | ICD-10-CM | POA: Diagnosis not present

## 2016-09-07 DIAGNOSIS — R251 Tremor, unspecified: Secondary | ICD-10-CM | POA: Diagnosis not present

## 2016-09-07 DIAGNOSIS — Z794 Long term (current) use of insulin: Secondary | ICD-10-CM | POA: Diagnosis not present

## 2016-09-07 DIAGNOSIS — E119 Type 2 diabetes mellitus without complications: Secondary | ICD-10-CM | POA: Diagnosis not present

## 2016-09-07 DIAGNOSIS — G20 Parkinson's disease: Secondary | ICD-10-CM | POA: Diagnosis not present

## 2016-09-13 DIAGNOSIS — H40003 Preglaucoma, unspecified, bilateral: Secondary | ICD-10-CM | POA: Diagnosis not present

## 2016-10-05 DIAGNOSIS — I25810 Atherosclerosis of coronary artery bypass graft(s) without angina pectoris: Secondary | ICD-10-CM | POA: Diagnosis not present

## 2016-10-05 DIAGNOSIS — Z79899 Other long term (current) drug therapy: Secondary | ICD-10-CM | POA: Diagnosis not present

## 2016-10-05 DIAGNOSIS — M25561 Pain in right knee: Secondary | ICD-10-CM | POA: Diagnosis not present

## 2016-10-05 DIAGNOSIS — E1165 Type 2 diabetes mellitus with hyperglycemia: Secondary | ICD-10-CM | POA: Diagnosis not present

## 2016-10-05 DIAGNOSIS — M545 Low back pain: Secondary | ICD-10-CM | POA: Diagnosis not present

## 2016-10-05 DIAGNOSIS — M62830 Muscle spasm of back: Secondary | ICD-10-CM | POA: Diagnosis not present

## 2016-10-21 DIAGNOSIS — I10 Essential (primary) hypertension: Secondary | ICD-10-CM | POA: Diagnosis not present

## 2016-10-21 DIAGNOSIS — E1165 Type 2 diabetes mellitus with hyperglycemia: Secondary | ICD-10-CM | POA: Diagnosis not present

## 2016-10-21 DIAGNOSIS — K210 Gastro-esophageal reflux disease with esophagitis: Secondary | ICD-10-CM | POA: Diagnosis not present

## 2016-11-30 DIAGNOSIS — N390 Urinary tract infection, site not specified: Secondary | ICD-10-CM | POA: Diagnosis not present

## 2016-12-13 DIAGNOSIS — N390 Urinary tract infection, site not specified: Secondary | ICD-10-CM | POA: Diagnosis not present

## 2016-12-16 DIAGNOSIS — I10 Essential (primary) hypertension: Secondary | ICD-10-CM | POA: Diagnosis not present

## 2016-12-16 DIAGNOSIS — E1165 Type 2 diabetes mellitus with hyperglycemia: Secondary | ICD-10-CM | POA: Diagnosis not present

## 2016-12-16 DIAGNOSIS — K210 Gastro-esophageal reflux disease with esophagitis: Secondary | ICD-10-CM | POA: Diagnosis not present

## 2017-01-10 DIAGNOSIS — E1165 Type 2 diabetes mellitus with hyperglycemia: Secondary | ICD-10-CM | POA: Diagnosis not present

## 2017-01-10 DIAGNOSIS — K210 Gastro-esophageal reflux disease with esophagitis: Secondary | ICD-10-CM | POA: Diagnosis not present

## 2017-01-10 DIAGNOSIS — E784 Other hyperlipidemia: Secondary | ICD-10-CM | POA: Diagnosis not present

## 2017-01-10 DIAGNOSIS — I25810 Atherosclerosis of coronary artery bypass graft(s) without angina pectoris: Secondary | ICD-10-CM | POA: Diagnosis not present

## 2017-01-10 DIAGNOSIS — Z0000 Encounter for general adult medical examination without abnormal findings: Secondary | ICD-10-CM | POA: Diagnosis not present

## 2017-01-21 DIAGNOSIS — E1165 Type 2 diabetes mellitus with hyperglycemia: Secondary | ICD-10-CM | POA: Diagnosis not present

## 2017-01-21 DIAGNOSIS — K210 Gastro-esophageal reflux disease with esophagitis: Secondary | ICD-10-CM | POA: Diagnosis not present

## 2017-01-21 DIAGNOSIS — I10 Essential (primary) hypertension: Secondary | ICD-10-CM | POA: Diagnosis not present

## 2017-02-02 DIAGNOSIS — R259 Unspecified abnormal involuntary movements: Secondary | ICD-10-CM | POA: Diagnosis not present

## 2017-02-02 DIAGNOSIS — G250 Essential tremor: Secondary | ICD-10-CM | POA: Diagnosis not present

## 2017-02-14 DIAGNOSIS — E1165 Type 2 diabetes mellitus with hyperglycemia: Secondary | ICD-10-CM | POA: Diagnosis not present

## 2017-02-14 DIAGNOSIS — I10 Essential (primary) hypertension: Secondary | ICD-10-CM | POA: Diagnosis not present

## 2017-02-14 DIAGNOSIS — K210 Gastro-esophageal reflux disease with esophagitis: Secondary | ICD-10-CM | POA: Diagnosis not present

## 2017-02-16 DIAGNOSIS — I6523 Occlusion and stenosis of bilateral carotid arteries: Secondary | ICD-10-CM | POA: Diagnosis not present

## 2017-02-16 DIAGNOSIS — I659 Occlusion and stenosis of unspecified precerebral artery: Secondary | ICD-10-CM | POA: Diagnosis not present

## 2017-02-18 DIAGNOSIS — H35033 Hypertensive retinopathy, bilateral: Secondary | ICD-10-CM | POA: Diagnosis not present

## 2017-02-18 DIAGNOSIS — H524 Presbyopia: Secondary | ICD-10-CM | POA: Diagnosis not present

## 2017-02-18 DIAGNOSIS — H40013 Open angle with borderline findings, low risk, bilateral: Secondary | ICD-10-CM | POA: Diagnosis not present

## 2017-02-28 DIAGNOSIS — R102 Pelvic and perineal pain: Secondary | ICD-10-CM | POA: Diagnosis not present

## 2017-02-28 DIAGNOSIS — N390 Urinary tract infection, site not specified: Secondary | ICD-10-CM | POA: Diagnosis not present

## 2017-02-28 DIAGNOSIS — Z7689 Persons encountering health services in other specified circumstances: Secondary | ICD-10-CM | POA: Diagnosis not present

## 2017-02-28 DIAGNOSIS — R1900 Intra-abdominal and pelvic swelling, mass and lump, unspecified site: Secondary | ICD-10-CM | POA: Diagnosis not present

## 2017-03-04 DIAGNOSIS — R1900 Intra-abdominal and pelvic swelling, mass and lump, unspecified site: Secondary | ICD-10-CM | POA: Diagnosis not present

## 2017-03-11 DIAGNOSIS — R1900 Intra-abdominal and pelvic swelling, mass and lump, unspecified site: Secondary | ICD-10-CM | POA: Diagnosis not present

## 2017-03-17 DIAGNOSIS — R102 Pelvic and perineal pain: Secondary | ICD-10-CM | POA: Diagnosis not present

## 2017-03-17 DIAGNOSIS — K210 Gastro-esophageal reflux disease with esophagitis: Secondary | ICD-10-CM | POA: Diagnosis not present

## 2017-03-17 DIAGNOSIS — I10 Essential (primary) hypertension: Secondary | ICD-10-CM | POA: Diagnosis not present

## 2017-03-17 DIAGNOSIS — N816 Rectocele: Secondary | ICD-10-CM | POA: Diagnosis not present

## 2017-03-17 DIAGNOSIS — K5909 Other constipation: Secondary | ICD-10-CM | POA: Diagnosis not present

## 2017-03-17 DIAGNOSIS — E1165 Type 2 diabetes mellitus with hyperglycemia: Secondary | ICD-10-CM | POA: Diagnosis not present

## 2017-03-28 DIAGNOSIS — I25810 Atherosclerosis of coronary artery bypass graft(s) without angina pectoris: Secondary | ICD-10-CM | POA: Diagnosis not present

## 2017-03-28 DIAGNOSIS — E1165 Type 2 diabetes mellitus with hyperglycemia: Secondary | ICD-10-CM | POA: Diagnosis not present

## 2017-03-28 DIAGNOSIS — J208 Acute bronchitis due to other specified organisms: Secondary | ICD-10-CM | POA: Diagnosis not present

## 2017-03-28 DIAGNOSIS — E784 Other hyperlipidemia: Secondary | ICD-10-CM | POA: Diagnosis not present

## 2017-03-28 DIAGNOSIS — K210 Gastro-esophageal reflux disease with esophagitis: Secondary | ICD-10-CM | POA: Diagnosis not present

## 2017-04-07 DIAGNOSIS — N816 Rectocele: Secondary | ICD-10-CM | POA: Diagnosis not present

## 2017-04-07 DIAGNOSIS — Z01818 Encounter for other preprocedural examination: Secondary | ICD-10-CM | POA: Diagnosis not present

## 2017-04-14 DIAGNOSIS — N840 Polyp of corpus uteri: Secondary | ICD-10-CM | POA: Diagnosis not present

## 2017-04-14 DIAGNOSIS — I10 Essential (primary) hypertension: Secondary | ICD-10-CM | POA: Diagnosis not present

## 2017-04-14 DIAGNOSIS — K219 Gastro-esophageal reflux disease without esophagitis: Secondary | ICD-10-CM | POA: Diagnosis not present

## 2017-04-14 DIAGNOSIS — E785 Hyperlipidemia, unspecified: Secondary | ICD-10-CM | POA: Diagnosis not present

## 2017-04-14 DIAGNOSIS — E119 Type 2 diabetes mellitus without complications: Secondary | ICD-10-CM | POA: Diagnosis not present

## 2017-04-14 DIAGNOSIS — Z79899 Other long term (current) drug therapy: Secondary | ICD-10-CM | POA: Diagnosis not present

## 2017-04-14 DIAGNOSIS — N816 Rectocele: Secondary | ICD-10-CM | POA: Diagnosis not present

## 2017-04-14 DIAGNOSIS — Z9049 Acquired absence of other specified parts of digestive tract: Secondary | ICD-10-CM | POA: Diagnosis not present

## 2017-04-14 DIAGNOSIS — Z794 Long term (current) use of insulin: Secondary | ICD-10-CM | POA: Diagnosis not present

## 2017-04-14 DIAGNOSIS — Z780 Asymptomatic menopausal state: Secondary | ICD-10-CM | POA: Diagnosis not present

## 2017-04-14 DIAGNOSIS — Z833 Family history of diabetes mellitus: Secondary | ICD-10-CM | POA: Diagnosis not present

## 2017-04-14 DIAGNOSIS — G250 Essential tremor: Secondary | ICD-10-CM | POA: Diagnosis not present

## 2017-04-14 DIAGNOSIS — R1900 Intra-abdominal and pelvic swelling, mass and lump, unspecified site: Secondary | ICD-10-CM | POA: Diagnosis not present

## 2017-04-14 DIAGNOSIS — Z8269 Family history of other diseases of the musculoskeletal system and connective tissue: Secondary | ICD-10-CM | POA: Diagnosis not present

## 2017-04-14 DIAGNOSIS — M545 Low back pain: Secondary | ICD-10-CM | POA: Diagnosis not present

## 2017-04-14 DIAGNOSIS — Z8249 Family history of ischemic heart disease and other diseases of the circulatory system: Secondary | ICD-10-CM | POA: Diagnosis not present

## 2017-04-18 DIAGNOSIS — N840 Polyp of corpus uteri: Secondary | ICD-10-CM | POA: Diagnosis not present

## 2017-04-18 DIAGNOSIS — N816 Rectocele: Secondary | ICD-10-CM | POA: Diagnosis not present

## 2017-04-18 DIAGNOSIS — R1900 Intra-abdominal and pelvic swelling, mass and lump, unspecified site: Secondary | ICD-10-CM | POA: Diagnosis not present

## 2017-04-18 DIAGNOSIS — I10 Essential (primary) hypertension: Secondary | ICD-10-CM | POA: Diagnosis not present

## 2017-04-18 DIAGNOSIS — K219 Gastro-esophageal reflux disease without esophagitis: Secondary | ICD-10-CM | POA: Diagnosis not present

## 2017-04-18 DIAGNOSIS — Z833 Family history of diabetes mellitus: Secondary | ICD-10-CM | POA: Diagnosis not present

## 2017-04-18 DIAGNOSIS — Z780 Asymptomatic menopausal state: Secondary | ICD-10-CM | POA: Diagnosis not present

## 2017-04-18 DIAGNOSIS — G250 Essential tremor: Secondary | ICD-10-CM | POA: Diagnosis not present

## 2017-04-18 DIAGNOSIS — E785 Hyperlipidemia, unspecified: Secondary | ICD-10-CM | POA: Diagnosis not present

## 2017-04-18 DIAGNOSIS — Z794 Long term (current) use of insulin: Secondary | ICD-10-CM | POA: Diagnosis not present

## 2017-04-18 DIAGNOSIS — M545 Low back pain: Secondary | ICD-10-CM | POA: Diagnosis not present

## 2017-04-18 DIAGNOSIS — Z79899 Other long term (current) drug therapy: Secondary | ICD-10-CM | POA: Diagnosis not present

## 2017-04-18 DIAGNOSIS — E119 Type 2 diabetes mellitus without complications: Secondary | ICD-10-CM | POA: Diagnosis not present

## 2017-04-18 DIAGNOSIS — Z8249 Family history of ischemic heart disease and other diseases of the circulatory system: Secondary | ICD-10-CM | POA: Diagnosis not present

## 2017-04-18 DIAGNOSIS — Z8269 Family history of other diseases of the musculoskeletal system and connective tissue: Secondary | ICD-10-CM | POA: Diagnosis not present

## 2017-04-18 DIAGNOSIS — Z9049 Acquired absence of other specified parts of digestive tract: Secondary | ICD-10-CM | POA: Diagnosis not present

## 2017-04-18 DIAGNOSIS — R102 Pelvic and perineal pain: Secondary | ICD-10-CM | POA: Diagnosis not present

## 2017-04-19 DIAGNOSIS — M545 Low back pain: Secondary | ICD-10-CM | POA: Diagnosis not present

## 2017-04-19 DIAGNOSIS — Z794 Long term (current) use of insulin: Secondary | ICD-10-CM | POA: Diagnosis not present

## 2017-04-19 DIAGNOSIS — E785 Hyperlipidemia, unspecified: Secondary | ICD-10-CM | POA: Diagnosis not present

## 2017-04-19 DIAGNOSIS — R1900 Intra-abdominal and pelvic swelling, mass and lump, unspecified site: Secondary | ICD-10-CM | POA: Diagnosis not present

## 2017-04-19 DIAGNOSIS — G250 Essential tremor: Secondary | ICD-10-CM | POA: Diagnosis not present

## 2017-04-19 DIAGNOSIS — K219 Gastro-esophageal reflux disease without esophagitis: Secondary | ICD-10-CM | POA: Diagnosis not present

## 2017-04-19 DIAGNOSIS — N816 Rectocele: Secondary | ICD-10-CM | POA: Diagnosis not present

## 2017-04-19 DIAGNOSIS — Z8269 Family history of other diseases of the musculoskeletal system and connective tissue: Secondary | ICD-10-CM | POA: Diagnosis not present

## 2017-04-19 DIAGNOSIS — Z833 Family history of diabetes mellitus: Secondary | ICD-10-CM | POA: Diagnosis not present

## 2017-04-19 DIAGNOSIS — I10 Essential (primary) hypertension: Secondary | ICD-10-CM | POA: Diagnosis not present

## 2017-04-19 DIAGNOSIS — Z79899 Other long term (current) drug therapy: Secondary | ICD-10-CM | POA: Diagnosis not present

## 2017-04-19 DIAGNOSIS — Z9049 Acquired absence of other specified parts of digestive tract: Secondary | ICD-10-CM | POA: Diagnosis not present

## 2017-04-19 DIAGNOSIS — E119 Type 2 diabetes mellitus without complications: Secondary | ICD-10-CM | POA: Diagnosis not present

## 2017-04-19 DIAGNOSIS — Z780 Asymptomatic menopausal state: Secondary | ICD-10-CM | POA: Diagnosis not present

## 2017-04-19 DIAGNOSIS — Z8249 Family history of ischemic heart disease and other diseases of the circulatory system: Secondary | ICD-10-CM | POA: Diagnosis not present

## 2017-04-19 DIAGNOSIS — N840 Polyp of corpus uteri: Secondary | ICD-10-CM | POA: Diagnosis not present

## 2017-04-26 DIAGNOSIS — K5909 Other constipation: Secondary | ICD-10-CM | POA: Diagnosis not present

## 2017-04-26 DIAGNOSIS — N390 Urinary tract infection, site not specified: Secondary | ICD-10-CM | POA: Diagnosis not present

## 2017-05-20 DIAGNOSIS — K210 Gastro-esophageal reflux disease with esophagitis: Secondary | ICD-10-CM | POA: Diagnosis not present

## 2017-05-20 DIAGNOSIS — I10 Essential (primary) hypertension: Secondary | ICD-10-CM | POA: Diagnosis not present

## 2017-05-20 DIAGNOSIS — E1165 Type 2 diabetes mellitus with hyperglycemia: Secondary | ICD-10-CM | POA: Diagnosis not present

## 2017-06-06 DIAGNOSIS — K210 Gastro-esophageal reflux disease with esophagitis: Secondary | ICD-10-CM | POA: Diagnosis not present

## 2017-06-06 DIAGNOSIS — I10 Essential (primary) hypertension: Secondary | ICD-10-CM | POA: Diagnosis not present

## 2017-06-06 DIAGNOSIS — E1165 Type 2 diabetes mellitus with hyperglycemia: Secondary | ICD-10-CM | POA: Diagnosis not present

## 2017-06-30 DIAGNOSIS — E1165 Type 2 diabetes mellitus with hyperglycemia: Secondary | ICD-10-CM | POA: Diagnosis not present

## 2017-06-30 DIAGNOSIS — I25810 Atherosclerosis of coronary artery bypass graft(s) without angina pectoris: Secondary | ICD-10-CM | POA: Diagnosis not present

## 2017-06-30 DIAGNOSIS — K210 Gastro-esophageal reflux disease with esophagitis: Secondary | ICD-10-CM | POA: Diagnosis not present

## 2017-06-30 DIAGNOSIS — E784 Other hyperlipidemia: Secondary | ICD-10-CM | POA: Diagnosis not present

## 2017-09-08 DIAGNOSIS — I10 Essential (primary) hypertension: Secondary | ICD-10-CM | POA: Diagnosis not present

## 2017-09-08 DIAGNOSIS — I6523 Occlusion and stenosis of bilateral carotid arteries: Secondary | ICD-10-CM | POA: Diagnosis not present

## 2017-09-08 DIAGNOSIS — E782 Mixed hyperlipidemia: Secondary | ICD-10-CM | POA: Diagnosis not present

## 2017-09-08 DIAGNOSIS — I2510 Atherosclerotic heart disease of native coronary artery without angina pectoris: Secondary | ICD-10-CM | POA: Diagnosis not present

## 2017-09-08 DIAGNOSIS — Z951 Presence of aortocoronary bypass graft: Secondary | ICD-10-CM | POA: Diagnosis not present

## 2017-09-15 DIAGNOSIS — I6523 Occlusion and stenosis of bilateral carotid arteries: Secondary | ICD-10-CM | POA: Diagnosis not present

## 2017-09-15 DIAGNOSIS — E782 Mixed hyperlipidemia: Secondary | ICD-10-CM | POA: Diagnosis not present

## 2017-09-19 DIAGNOSIS — H40013 Open angle with borderline findings, low risk, bilateral: Secondary | ICD-10-CM | POA: Diagnosis not present

## 2017-09-20 DIAGNOSIS — E1165 Type 2 diabetes mellitus with hyperglycemia: Secondary | ICD-10-CM | POA: Diagnosis not present

## 2017-09-20 DIAGNOSIS — I10 Essential (primary) hypertension: Secondary | ICD-10-CM | POA: Diagnosis not present

## 2017-09-20 DIAGNOSIS — K210 Gastro-esophageal reflux disease with esophagitis: Secondary | ICD-10-CM | POA: Diagnosis not present

## 2017-09-26 DIAGNOSIS — I6523 Occlusion and stenosis of bilateral carotid arteries: Secondary | ICD-10-CM | POA: Diagnosis not present

## 2017-10-03 DIAGNOSIS — Z0000 Encounter for general adult medical examination without abnormal findings: Secondary | ICD-10-CM | POA: Diagnosis not present

## 2017-10-03 DIAGNOSIS — Z1389 Encounter for screening for other disorder: Secondary | ICD-10-CM | POA: Diagnosis not present

## 2017-10-03 DIAGNOSIS — K210 Gastro-esophageal reflux disease with esophagitis: Secondary | ICD-10-CM | POA: Diagnosis not present

## 2017-10-03 DIAGNOSIS — E1165 Type 2 diabetes mellitus with hyperglycemia: Secondary | ICD-10-CM | POA: Diagnosis not present

## 2017-10-03 DIAGNOSIS — E7849 Other hyperlipidemia: Secondary | ICD-10-CM | POA: Diagnosis not present

## 2017-10-03 DIAGNOSIS — I10 Essential (primary) hypertension: Secondary | ICD-10-CM | POA: Diagnosis not present

## 2017-10-25 DIAGNOSIS — E7849 Other hyperlipidemia: Secondary | ICD-10-CM | POA: Diagnosis not present

## 2017-10-25 DIAGNOSIS — K210 Gastro-esophageal reflux disease with esophagitis: Secondary | ICD-10-CM | POA: Diagnosis not present

## 2017-10-25 DIAGNOSIS — E1165 Type 2 diabetes mellitus with hyperglycemia: Secondary | ICD-10-CM | POA: Diagnosis not present

## 2017-10-25 DIAGNOSIS — I10 Essential (primary) hypertension: Secondary | ICD-10-CM | POA: Diagnosis not present

## 2017-11-25 DIAGNOSIS — E7849 Other hyperlipidemia: Secondary | ICD-10-CM | POA: Diagnosis not present

## 2017-11-25 DIAGNOSIS — K210 Gastro-esophageal reflux disease with esophagitis: Secondary | ICD-10-CM | POA: Diagnosis not present

## 2017-11-25 DIAGNOSIS — I10 Essential (primary) hypertension: Secondary | ICD-10-CM | POA: Diagnosis not present

## 2017-11-25 DIAGNOSIS — E1165 Type 2 diabetes mellitus with hyperglycemia: Secondary | ICD-10-CM | POA: Diagnosis not present

## 2017-12-29 DIAGNOSIS — E7849 Other hyperlipidemia: Secondary | ICD-10-CM | POA: Diagnosis not present

## 2017-12-29 DIAGNOSIS — K210 Gastro-esophageal reflux disease with esophagitis: Secondary | ICD-10-CM | POA: Diagnosis not present

## 2017-12-29 DIAGNOSIS — I10 Essential (primary) hypertension: Secondary | ICD-10-CM | POA: Diagnosis not present

## 2017-12-29 DIAGNOSIS — E1165 Type 2 diabetes mellitus with hyperglycemia: Secondary | ICD-10-CM | POA: Diagnosis not present

## 2017-12-29 DIAGNOSIS — Z1389 Encounter for screening for other disorder: Secondary | ICD-10-CM | POA: Diagnosis not present

## 2017-12-29 DIAGNOSIS — G20 Parkinson's disease: Secondary | ICD-10-CM | POA: Diagnosis not present

## 2017-12-29 DIAGNOSIS — Z0000 Encounter for general adult medical examination without abnormal findings: Secondary | ICD-10-CM | POA: Diagnosis not present

## 2018-01-20 DIAGNOSIS — E7849 Other hyperlipidemia: Secondary | ICD-10-CM | POA: Diagnosis not present

## 2018-01-20 DIAGNOSIS — I10 Essential (primary) hypertension: Secondary | ICD-10-CM | POA: Diagnosis not present

## 2018-01-20 DIAGNOSIS — K210 Gastro-esophageal reflux disease with esophagitis: Secondary | ICD-10-CM | POA: Diagnosis not present

## 2018-01-20 DIAGNOSIS — E1165 Type 2 diabetes mellitus with hyperglycemia: Secondary | ICD-10-CM | POA: Diagnosis not present

## 2018-02-01 DIAGNOSIS — I6523 Occlusion and stenosis of bilateral carotid arteries: Secondary | ICD-10-CM | POA: Diagnosis not present

## 2018-02-02 DIAGNOSIS — R251 Tremor, unspecified: Secondary | ICD-10-CM | POA: Diagnosis not present

## 2018-02-02 DIAGNOSIS — R5383 Other fatigue: Secondary | ICD-10-CM | POA: Diagnosis not present

## 2018-03-06 DIAGNOSIS — E1165 Type 2 diabetes mellitus with hyperglycemia: Secondary | ICD-10-CM | POA: Diagnosis not present

## 2018-03-06 DIAGNOSIS — E7849 Other hyperlipidemia: Secondary | ICD-10-CM | POA: Diagnosis not present

## 2018-03-06 DIAGNOSIS — K210 Gastro-esophageal reflux disease with esophagitis: Secondary | ICD-10-CM | POA: Diagnosis not present

## 2018-03-06 DIAGNOSIS — I10 Essential (primary) hypertension: Secondary | ICD-10-CM | POA: Diagnosis not present

## 2018-04-05 DIAGNOSIS — K210 Gastro-esophageal reflux disease with esophagitis: Secondary | ICD-10-CM | POA: Diagnosis not present

## 2018-04-05 DIAGNOSIS — Z1389 Encounter for screening for other disorder: Secondary | ICD-10-CM | POA: Diagnosis not present

## 2018-04-05 DIAGNOSIS — Z0000 Encounter for general adult medical examination without abnormal findings: Secondary | ICD-10-CM | POA: Diagnosis not present

## 2018-04-05 DIAGNOSIS — G20 Parkinson's disease: Secondary | ICD-10-CM | POA: Diagnosis not present

## 2018-04-05 DIAGNOSIS — I10 Essential (primary) hypertension: Secondary | ICD-10-CM | POA: Diagnosis not present

## 2018-04-05 DIAGNOSIS — E7849 Other hyperlipidemia: Secondary | ICD-10-CM | POA: Diagnosis not present

## 2018-04-05 DIAGNOSIS — E1165 Type 2 diabetes mellitus with hyperglycemia: Secondary | ICD-10-CM | POA: Diagnosis not present

## 2018-05-15 DIAGNOSIS — E7849 Other hyperlipidemia: Secondary | ICD-10-CM | POA: Diagnosis not present

## 2018-05-15 DIAGNOSIS — K210 Gastro-esophageal reflux disease with esophagitis: Secondary | ICD-10-CM | POA: Diagnosis not present

## 2018-05-15 DIAGNOSIS — E1165 Type 2 diabetes mellitus with hyperglycemia: Secondary | ICD-10-CM | POA: Diagnosis not present

## 2018-05-15 DIAGNOSIS — I10 Essential (primary) hypertension: Secondary | ICD-10-CM | POA: Diagnosis not present

## 2018-05-19 DIAGNOSIS — R300 Dysuria: Secondary | ICD-10-CM | POA: Diagnosis not present

## 2018-05-19 DIAGNOSIS — N390 Urinary tract infection, site not specified: Secondary | ICD-10-CM | POA: Diagnosis not present

## 2018-05-23 DIAGNOSIS — R251 Tremor, unspecified: Secondary | ICD-10-CM | POA: Diagnosis not present

## 2018-05-23 DIAGNOSIS — G20 Parkinson's disease: Secondary | ICD-10-CM | POA: Insufficient documentation

## 2018-06-01 DIAGNOSIS — R300 Dysuria: Secondary | ICD-10-CM | POA: Diagnosis not present

## 2018-06-26 DIAGNOSIS — R350 Frequency of micturition: Secondary | ICD-10-CM | POA: Diagnosis not present

## 2018-06-26 DIAGNOSIS — N952 Postmenopausal atrophic vaginitis: Secondary | ICD-10-CM | POA: Diagnosis not present

## 2018-06-26 DIAGNOSIS — R1031 Right lower quadrant pain: Secondary | ICD-10-CM | POA: Diagnosis not present

## 2018-06-26 DIAGNOSIS — R3121 Asymptomatic microscopic hematuria: Secondary | ICD-10-CM | POA: Diagnosis not present

## 2018-06-26 DIAGNOSIS — R3915 Urgency of urination: Secondary | ICD-10-CM | POA: Diagnosis not present

## 2018-07-18 DIAGNOSIS — E1165 Type 2 diabetes mellitus with hyperglycemia: Secondary | ICD-10-CM | POA: Diagnosis not present

## 2018-07-18 DIAGNOSIS — K210 Gastro-esophageal reflux disease with esophagitis: Secondary | ICD-10-CM | POA: Diagnosis not present

## 2018-07-18 DIAGNOSIS — I10 Essential (primary) hypertension: Secondary | ICD-10-CM | POA: Diagnosis not present

## 2018-07-18 DIAGNOSIS — G20 Parkinson's disease: Secondary | ICD-10-CM | POA: Diagnosis not present

## 2018-07-18 DIAGNOSIS — E7849 Other hyperlipidemia: Secondary | ICD-10-CM | POA: Diagnosis not present

## 2018-07-19 DIAGNOSIS — N200 Calculus of kidney: Secondary | ICD-10-CM | POA: Diagnosis not present

## 2018-07-19 DIAGNOSIS — R3121 Asymptomatic microscopic hematuria: Secondary | ICD-10-CM | POA: Diagnosis not present

## 2018-07-20 DIAGNOSIS — R3121 Asymptomatic microscopic hematuria: Secondary | ICD-10-CM | POA: Diagnosis not present

## 2018-07-20 DIAGNOSIS — N3010 Interstitial cystitis (chronic) without hematuria: Secondary | ICD-10-CM | POA: Diagnosis not present

## 2018-07-26 DIAGNOSIS — K210 Gastro-esophageal reflux disease with esophagitis: Secondary | ICD-10-CM | POA: Diagnosis not present

## 2018-07-26 DIAGNOSIS — E7849 Other hyperlipidemia: Secondary | ICD-10-CM | POA: Diagnosis not present

## 2018-07-26 DIAGNOSIS — I10 Essential (primary) hypertension: Secondary | ICD-10-CM | POA: Diagnosis not present

## 2018-07-26 DIAGNOSIS — G20 Parkinson's disease: Secondary | ICD-10-CM | POA: Diagnosis not present

## 2018-07-26 DIAGNOSIS — E1165 Type 2 diabetes mellitus with hyperglycemia: Secondary | ICD-10-CM | POA: Diagnosis not present

## 2018-08-17 DIAGNOSIS — G20 Parkinson's disease: Secondary | ICD-10-CM | POA: Diagnosis not present

## 2018-08-17 DIAGNOSIS — I10 Essential (primary) hypertension: Secondary | ICD-10-CM | POA: Diagnosis not present

## 2018-08-17 DIAGNOSIS — E1165 Type 2 diabetes mellitus with hyperglycemia: Secondary | ICD-10-CM | POA: Diagnosis not present

## 2018-08-17 DIAGNOSIS — K210 Gastro-esophageal reflux disease with esophagitis: Secondary | ICD-10-CM | POA: Diagnosis not present

## 2018-08-17 DIAGNOSIS — E7849 Other hyperlipidemia: Secondary | ICD-10-CM | POA: Diagnosis not present

## 2018-08-23 ENCOUNTER — Other Ambulatory Visit: Payer: Self-pay | Admitting: Urology

## 2018-08-23 NOTE — Progress Notes (Signed)
Please place orders in epic pt. Has a preop 08-24-18 @ 1100. Thank You

## 2018-08-23 NOTE — Patient Instructions (Addendum)
Alexa Mcpherson  08/23/2018   Your procedure is scheduled on: 08-30-18  Report to [facility] Main  Entrance  Report to admitting at    [place] AM    Call this number if you have problems the morning of surgery [phone]   Remember: Do not eat food or drink liquids :After Midnight.     Take these medicines the morning of surgery with A SIP OF WATER: crestor, propranolol, primidone, paxil, omeprazole, cimetidine DO NOT TAKE ANY DIABETIC MEDICATIONS DAY OF YOUR SURGERY              How to Manage Your Diabetes Before and After Surgery  Why is it important to control my blood sugar before and after surgery? . Improving blood sugar levels before and after surgery helps healing and can limit problems. . A way of improving blood sugar control is eating a healthy diet by: o  Eating less sugar and carbohydrates o  Increasing activity/exercise o  Talking with your doctor about reaching your blood sugar goals . High blood sugars (greater than 180 mg/dL) can raise your risk of infections and slow your recovery, so you will need to focus on controlling your diabetes during the weeks before surgery. . Make sure that the doctor who takes care of your diabetes knows about your planned surgery including the date and location.  How do I manage my blood sugar before surgery? . Check your blood sugar at least 4 times a day, starting 2 days before surgery, to make sure that the level is not too high or low. o Check your blood sugar the morning of your surgery when you wake up and every 2 hours until you get to the Short Stay unit. . If your blood sugar is less than 70 mg/dL, you will need to treat for low blood sugar: o Do not take insulin. o Treat a low blood sugar (less than 70 mg/dL) with  cup of clear juice (cranberry or apple), 4 glucose tablets, OR glucose gel. o Recheck blood sugar in 15 minutes after treatment (to make sure it is greater than 70 mg/dL). If your blood sugar  is not greater than 70 mg/dL on recheck, call [phone] for further instructions. . Report your blood sugar to the short stay nurse when you get to Short Stay.  . If you are admitted to the hospital after surgery: o Your blood sugar will be checked by the staff and you will probably be given insulin after surgery (instead of oral diabetes medicines) to make sure you have good blood sugar levels. o The goal for blood sugar control after surgery is 80-180 mg/dL.   WHAT DO I DO ABOUT MY DIABETES MEDICATION?       Marland Kitchen Do not take oral diabetes medicines (pills) the morning of surgery.  . THE NIGHT BEFORE SURGERY, take   14  units of   70/30     Insulin. .          Do not take bedtime dose of glipizide day before surgery  . THE MORNING OF SURGERY, take 0   units of  insulin.  . The day of surgery, do not take other diabetes injectables, including Byetta (exenatide), Bydureon (exenatide ER), Victoza (liraglutide), or Trulicity (dulaglutide).  . Dennis Bast may not have any metal on your body including hair  pins and              piercings  Do not wear jewelry, make-up, lotions, powders or perfumes, deodorant             Do not wear nail polish.  Do not shave  48 hours prior to surgery.  .   Do not bring valuables to the hospital. [place].  Contacts, dentures or bridgework may not be worn into surgery.       Patients discharged the day of surgery will not be allowed to drive home.  Name and phone number of your driver:  Special Instructions: N/A              Please read over the following fact sheets you were given: _____________________________________________________________________             [facility] - Preparing for Surgery Before surgery, you can play an important role.  Because skin is not sterile, your skin needs to be as free of germs as possible.  You can reduce the number of germs on your skin by washing with  CHG (chlorahexidine gluconate) soap before surgery.  CHG is an antiseptic cleaner which kills germs and bonds with the skin to continue killing germs even after washing. Please DO NOT use if you have an allergy to CHG or antibacterial soaps.  If your skin becomes reddened/irritated stop using the CHG and inform your nurse when you arrive at Short Stay. Do not shave (including legs and underarms) for at least 48 hours prior to the first CHG shower.  You may shave your face/neck. Please follow these instructions carefully:  1.  Shower with CHG Soap the night before surgery and the  morning of Surgery.  2.  If you choose to wash your hair, wash your hair first as usual with your  normal  shampoo.  3.  After you shampoo, rinse your hair and body thoroughly to remove the  shampoo.                           4.  Use CHG as you would any other liquid soap.  You can apply chg directly  to the skin and wash                       Gently with a scrungie or clean washcloth.  5.  Apply the CHG Soap to your body ONLY FROM THE NECK DOWN.   Do not use on face/ open                           Wound or open sores. Avoid contact with eyes, ears mouth and genitals (private parts).                       Wash face,  Genitals (private parts) with your normal soap.             6.  Wash thoroughly, paying special attention to the area where your surgery  will be performed.  7.  Thoroughly rinse your body with warm water from the neck down.  8.  DO NOT shower/wash with your normal soap after using and rinsing off  the CHG Soap.  9.  Pat yourself dry with a clean towel.            10.  Wear clean pajamas.            11.  Place clean sheets on your bed the night of your first shower and do not  sleep with pets. Day of Surgery : Do not apply any lotions/deodorants the morning of surgery.  Please wear clean clothes to the hospital/surgery center.  FAILURE TO FOLLOW THESE INSTRUCTIONS MAY RESULT IN THE CANCELLATION  OF YOUR SURGERY PATIENT SIGNATURE_________________________________  NURSE SIGNATURE__________________________________  ________________________________________________________________________

## 2018-08-24 ENCOUNTER — Encounter (HOSPITAL_COMMUNITY)
Admission: RE | Admit: 2018-08-24 | Discharge: 2018-08-24 | Disposition: A | Discharge status: Home / Self Care | Payer: Medicare Other | Source: Ambulatory Visit | Attending: Urology | Admitting: Urology

## 2018-08-24 ENCOUNTER — Other Ambulatory Visit: Payer: Self-pay

## 2018-08-24 ENCOUNTER — Encounter (HOSPITAL_COMMUNITY): Payer: Self-pay

## 2018-08-24 DIAGNOSIS — Z01810 Encounter for preprocedural cardiovascular examination: Secondary | ICD-10-CM | POA: Diagnosis not present

## 2018-08-24 DIAGNOSIS — N3011 Interstitial cystitis (chronic) with hematuria: Secondary | ICD-10-CM | POA: Insufficient documentation

## 2018-08-24 DIAGNOSIS — Z01812 Encounter for preprocedural laboratory examination: Secondary | ICD-10-CM | POA: Insufficient documentation

## 2018-08-24 LAB — BASIC METABOLIC PANEL
Anion gap: 10 (ref 5–15)
BUN: 52 mg/dL — ABNORMAL HIGH (ref 8–23)
CALCIUM: 9.6 mg/dL (ref 8.9–10.3)
CO2: 21 mmol/L — ABNORMAL LOW (ref 22–32)
Chloride: 107 mmol/L (ref 98–111)
Creatinine, Ser: 0.91 mg/dL (ref 0.44–1.00)
Glucose, Bld: 128 mg/dL — ABNORMAL HIGH (ref 70–99)
Potassium: 5.2 mmol/L — ABNORMAL HIGH (ref 3.5–5.1)
SODIUM: 138 mmol/L (ref 135–145)

## 2018-08-24 LAB — HEMOGLOBIN A1C
HEMOGLOBIN A1C: 6.3 % — AB (ref 4.8–5.6)
MEAN PLASMA GLUCOSE: 134.11 mg/dL

## 2018-08-24 LAB — CBC
HCT: 38.4 % (ref 36.0–46.0)
Hemoglobin: 12.1 g/dL (ref 12.0–15.0)
MCH: 27.6 pg (ref 26.0–34.0)
MCHC: 31.5 g/dL (ref 30.0–36.0)
MCV: 87.7 fL (ref 78.0–100.0)
PLATELETS: 318 10*3/uL (ref 150–400)
RBC: 4.38 MIL/uL (ref 3.87–5.11)
RDW: 14.2 % (ref 11.5–15.5)
WBC: 7.8 10*3/uL (ref 4.0–10.5)

## 2018-08-24 LAB — GLUCOSE, CAPILLARY: GLUCOSE-CAPILLARY: 123 mg/dL — AB (ref 70–99)

## 2018-08-24 NOTE — Progress Notes (Signed)
Clearance Dr. Mauricio Po cardiology on chart 07-20-18

## 2018-08-24 NOTE — Progress Notes (Signed)
BMP done 08-24-18 routed to Dr. Gloriann Loan via epic

## 2018-08-25 NOTE — Progress Notes (Signed)
Final EKG done 08-24-18 in epic

## 2018-08-29 NOTE — Anesthesia Preprocedure Evaluation (Addendum)
Anesthesia Evaluation  Patient identified by MRN, date of birth, ID band Patient awake    Reviewed: Allergy & Precautions, NPO status , Patient's Chart, lab work & pertinent test results  Airway Mallampati: II  TM Distance: >3 FB Neck ROM: Full    Dental  (+) Dental Advisory Given   Pulmonary neg pulmonary ROS,    breath sounds clear to auscultation       Cardiovascular hypertension, Pt. on medications and Pt. on home beta blockers + CAD, + CABG and + Peripheral Vascular Disease   Rhythm:Regular Rate:Normal     Neuro/Psych  Headaches, Depression    GI/Hepatic Neg liver ROS, GERD  Medicated,  Endo/Other  diabetes, Type 2, Oral Hypoglycemic Agents, Insulin Dependent  Renal/GU negative Renal ROS     Musculoskeletal   Abdominal   Peds  Hematology negative hematology ROS (+)   Anesthesia Other Findings   Reproductive/Obstetrics                            Lab Results  Component Value Date   WBC 7.8 08/24/2018   HGB 12.1 08/24/2018   HCT 38.4 08/24/2018   MCV 87.7 08/24/2018   PLT 318 08/24/2018   Lab Results  Component Value Date   CREATININE 0.91 08/24/2018   BUN 52 (H) 08/24/2018   NA 138 08/24/2018   K 5.2 (H) 08/24/2018   CL 107 08/24/2018   CO2 21 (L) 08/24/2018    Anesthesia Physical Anesthesia Plan  ASA: III  Anesthesia Plan: General   Post-op Pain Management:    Induction: Intravenous  PONV Risk Score and Plan: 3 and Dexamethasone, Ondansetron and Treatment may vary due to age or medical condition  Airway Management Planned: LMA  Additional Equipment:   Intra-op Plan:   Post-operative Plan: Extubation in OR  Informed Consent: I have reviewed the patients History and Physical, chart, labs and discussed the procedure including the risks, benefits and alternatives for the proposed anesthesia with the patient or authorized representative who has indicated his/her  understanding and acceptance.   Dental advisory given  Plan Discussed with: CRNA  Anesthesia Plan Comments:        Anesthesia Quick Evaluation

## 2018-08-30 ENCOUNTER — Ambulatory Visit (HOSPITAL_COMMUNITY)
Admission: RE | Admit: 2018-08-30 | Discharge: 2018-08-30 | Disposition: A | Discharge status: Home / Self Care | Payer: Medicare Other | Source: Ambulatory Visit | Attending: Urology | Admitting: Urology

## 2018-08-30 ENCOUNTER — Encounter (HOSPITAL_COMMUNITY): Payer: Self-pay | Admitting: Registered Nurse

## 2018-08-30 ENCOUNTER — Ambulatory Visit (HOSPITAL_COMMUNITY): Payer: Medicare Other | Admitting: Anesthesiology

## 2018-08-30 ENCOUNTER — Encounter (HOSPITAL_COMMUNITY)
Admission: RE | Disposition: A | Discharge status: Home / Self Care | Payer: Self-pay | Source: Ambulatory Visit | Attending: Urology

## 2018-08-30 DIAGNOSIS — F329 Major depressive disorder, single episode, unspecified: Secondary | ICD-10-CM | POA: Diagnosis not present

## 2018-08-30 DIAGNOSIS — Z79899 Other long term (current) drug therapy: Secondary | ICD-10-CM | POA: Insufficient documentation

## 2018-08-30 DIAGNOSIS — N3011 Interstitial cystitis (chronic) with hematuria: Secondary | ICD-10-CM | POA: Diagnosis not present

## 2018-08-30 DIAGNOSIS — N3289 Other specified disorders of bladder: Secondary | ICD-10-CM | POA: Diagnosis not present

## 2018-08-30 DIAGNOSIS — E119 Type 2 diabetes mellitus without complications: Secondary | ICD-10-CM | POA: Diagnosis not present

## 2018-08-30 DIAGNOSIS — K219 Gastro-esophageal reflux disease without esophagitis: Secondary | ICD-10-CM | POA: Insufficient documentation

## 2018-08-30 DIAGNOSIS — Z794 Long term (current) use of insulin: Secondary | ICD-10-CM | POA: Diagnosis not present

## 2018-08-30 DIAGNOSIS — I2510 Atherosclerotic heart disease of native coronary artery without angina pectoris: Secondary | ICD-10-CM | POA: Diagnosis not present

## 2018-08-30 DIAGNOSIS — I10 Essential (primary) hypertension: Secondary | ICD-10-CM | POA: Insufficient documentation

## 2018-08-30 DIAGNOSIS — N3010 Interstitial cystitis (chronic) without hematuria: Secondary | ICD-10-CM | POA: Diagnosis not present

## 2018-08-30 DIAGNOSIS — R3121 Asymptomatic microscopic hematuria: Secondary | ICD-10-CM | POA: Diagnosis not present

## 2018-08-30 DIAGNOSIS — D303 Benign neoplasm of bladder: Secondary | ICD-10-CM | POA: Insufficient documentation

## 2018-08-30 DIAGNOSIS — Z951 Presence of aortocoronary bypass graft: Secondary | ICD-10-CM | POA: Diagnosis not present

## 2018-08-30 DIAGNOSIS — N3041 Irradiation cystitis with hematuria: Secondary | ICD-10-CM | POA: Diagnosis not present

## 2018-08-30 DIAGNOSIS — I25810 Atherosclerosis of coronary artery bypass graft(s) without angina pectoris: Secondary | ICD-10-CM | POA: Diagnosis not present

## 2018-08-30 HISTORY — PX: CYSTOSCOPY WITH HYDRODISTENSION AND BIOPSY: SHX5127

## 2018-08-30 LAB — GLUCOSE, CAPILLARY
GLUCOSE-CAPILLARY: 112 mg/dL — AB (ref 70–99)
Glucose-Capillary: 138 mg/dL — ABNORMAL HIGH (ref 70–99)

## 2018-08-30 SURGERY — CYSTOSCOPY, WITH BLADDER HYDRODISTENSION AND BIOPSY
Anesthesia: General

## 2018-08-30 MED ORDER — PROPOFOL 10 MG/ML IV BOLUS
INTRAVENOUS | Status: AC
  Filled 2018-08-30: qty 20

## 2018-08-30 MED ORDER — FENTANYL CITRATE (PF) 100 MCG/2ML IJ SOLN
INTRAMUSCULAR | Status: AC
  Filled 2018-08-30: qty 2

## 2018-08-30 MED ORDER — CEFAZOLIN SODIUM-DEXTROSE 2-4 GM/100ML-% IV SOLN
INTRAVENOUS | Status: AC
  Filled 2018-08-30: qty 100

## 2018-08-30 MED ORDER — PROPOFOL 10 MG/ML IV BOLUS
INTRAVENOUS | Status: DC | PRN
  Administered 2018-08-30: 80 mg via INTRAVENOUS

## 2018-08-30 MED ORDER — EPHEDRINE 5 MG/ML INJ
INTRAVENOUS | Status: AC
  Filled 2018-08-30: qty 10

## 2018-08-30 MED ORDER — DEXAMETHASONE SODIUM PHOSPHATE 10 MG/ML IJ SOLN
INTRAMUSCULAR | Status: DC | PRN
  Administered 2018-08-30: 5 mg via INTRAVENOUS

## 2018-08-30 MED ORDER — ONDANSETRON HCL 4 MG/2ML IJ SOLN
INTRAMUSCULAR | Status: DC | PRN
  Administered 2018-08-30: 4 mg via INTRAVENOUS

## 2018-08-30 MED ORDER — ALBUMIN HUMAN 5 % IV SOLN
INTRAVENOUS | Status: AC
  Filled 2018-08-30: qty 250

## 2018-08-30 MED ORDER — LACTATED RINGERS IV SOLN
INTRAVENOUS | Status: DC
  Administered 2018-08-30: 1000 mL via INTRAVENOUS

## 2018-08-30 MED ORDER — FENTANYL CITRATE (PF) 100 MCG/2ML IJ SOLN
INTRAMUSCULAR | Status: AC
  Administered 2018-08-30: 50 ug via INTRAVENOUS
  Filled 2018-08-30: qty 2

## 2018-08-30 MED ORDER — DEXMEDETOMIDINE HCL IN NACL 200 MCG/50ML IV SOLN
INTRAVENOUS | Status: AC
  Filled 2018-08-30: qty 50

## 2018-08-30 MED ORDER — HYDROMORPHONE HCL 1 MG/ML IJ SOLN
0.2500 mg | INTRAMUSCULAR | Status: DC | PRN
  Administered 2018-08-30 (×2): 0.5 mg via INTRAVENOUS

## 2018-08-30 MED ORDER — HYDROCODONE-ACETAMINOPHEN 5-325 MG PO TABS
1.0000 | ORAL_TABLET | Freq: Once | ORAL | Status: AC
  Administered 2018-08-30: 1 via ORAL

## 2018-08-30 MED ORDER — ONDANSETRON HCL 4 MG/2ML IJ SOLN
INTRAMUSCULAR | Status: AC
  Filled 2018-08-30: qty 2

## 2018-08-30 MED ORDER — EPHEDRINE SULFATE-NACL 50-0.9 MG/10ML-% IV SOSY
PREFILLED_SYRINGE | INTRAVENOUS | Status: DC | PRN
  Administered 2018-08-30: 15 mg via INTRAVENOUS

## 2018-08-30 MED ORDER — LIDOCAINE 2% (20 MG/ML) 5 ML SYRINGE
INTRAMUSCULAR | Status: DC | PRN
  Administered 2018-08-30: 60 mg via INTRAVENOUS

## 2018-08-30 MED ORDER — DEXAMETHASONE SODIUM PHOSPHATE 10 MG/ML IJ SOLN
INTRAMUSCULAR | Status: AC
  Filled 2018-08-30: qty 1

## 2018-08-30 MED ORDER — CEFAZOLIN SODIUM-DEXTROSE 2-4 GM/100ML-% IV SOLN
2.0000 g | INTRAVENOUS | Status: AC
  Administered 2018-08-30: 2 g via INTRAVENOUS

## 2018-08-30 MED ORDER — FENTANYL CITRATE (PF) 100 MCG/2ML IJ SOLN
INTRAMUSCULAR | Status: DC | PRN
  Administered 2018-08-30: 25 ug via INTRAVENOUS
  Administered 2018-08-30: 50 ug via INTRAVENOUS
  Administered 2018-08-30: 25 ug via INTRAVENOUS

## 2018-08-30 MED ORDER — HYDROCODONE-ACETAMINOPHEN 5-325 MG PO TABS
ORAL_TABLET | ORAL | Status: AC
  Administered 2018-08-30: 1 via ORAL
  Filled 2018-08-30: qty 1

## 2018-08-30 MED ORDER — HYDROMORPHONE HCL 1 MG/ML IJ SOLN
INTRAMUSCULAR | Status: AC
  Filled 2018-08-30: qty 1

## 2018-08-30 MED ORDER — FENTANYL CITRATE (PF) 100 MCG/2ML IJ SOLN
25.0000 ug | INTRAMUSCULAR | Status: DC | PRN
  Administered 2018-08-30 (×3): 50 ug via INTRAVENOUS

## 2018-08-30 MED ORDER — LIDOCAINE 2% (20 MG/ML) 5 ML SYRINGE
INTRAMUSCULAR | Status: AC
  Filled 2018-08-30: qty 5

## 2018-08-30 MED ORDER — PROMETHAZINE HCL 25 MG/ML IJ SOLN: 6.2500 mg | INTRAMUSCULAR | Status: DC | PRN

## 2018-08-30 MED ORDER — HYDROCODONE-ACETAMINOPHEN 5-325 MG PO TABS: 1.0000 | ORAL_TABLET | ORAL | 0 refills | Status: AC | PRN

## 2018-08-30 SURGICAL SUPPLY — 11 items
BAG URO CATCHER STRL LF (MISCELLANEOUS) ×2 IMPLANT
CATH ROBINSON RED A/P 16FR (CATHETERS) IMPLANT
GLOVE BIO SURGEON STRL SZ7.5 (GLOVE) ×2 IMPLANT
GOWN STRL REUS W/TWL LRG LVL3 (GOWN DISPOSABLE) ×4 IMPLANT
MANIFOLD NEPTUNE II (INSTRUMENTS) ×2 IMPLANT
NDL SAFETY ECLIPSE 18X1.5 (NEEDLE) IMPLANT
NEEDLE HYPO 18GX1.5 SHARP (NEEDLE)
NEEDLE HYPO 22GX1.5 SAFETY (NEEDLE) IMPLANT
PACK CYSTO (CUSTOM PROCEDURE TRAY) ×2 IMPLANT
TUBING CONNECTING 10 (TUBING) ×2 IMPLANT
WATER STERILE IRR 3000ML UROMA (IV SOLUTION) ×2 IMPLANT

## 2018-08-30 NOTE — Discharge Instructions (Addendum)
Transurethral Bladder Biopsy  General instructions:     Your recent bladder surgery requires very little post hospital care but some definite precautions.  Despite the fact that no skin incisions were used, the area around the bladder incisions are raw and covered with scabs to promote healing and prevent bleeding. Certain precautions are needed to insure that the scabs are not disturbed over the next 2-4 weeks while the healing proceeds.  Because the raw surface inside your bladder and the irritating effects of urine you may expect frequency of urination and/or urgency (a stronger desire to urinate) and perhaps even getting up at night more often. This will usually resolve or improve slowly over the healing period. You may see some blood in your urine over the first 6 weeks. Do not be alarmed, even if the urine was clear for a while. Get off your feet and drink lots of fluids until clearing occurs. If you start to pass clots or don't improve call us.  Diet:  You may return to your normal diet immediately. Because of the raw surface of your bladder, alcohol, spicy foods, foods high in acid and drinks with caffeine may cause irritation or frequency and should be used in moderation. To keep your urine flowing freely and avoid constipation, drink plenty of fluids during the day (8-10 glasses). Tip: Avoid cranberry juice because it is very acidic.  Activity:  Your physical activity doesn't need to be restricted. However, if you are very active, you may see some blood in the urine. We suggest that you reduce your activity under the circumstances until the bleeding has stopped.  Bowels:  It is important to keep your bowels regular during the postoperative period. Straining with bowel movements can cause bleeding. A bowel movement every other day is reasonable. Use a mild laxative if needed, such as milk of magnesia 2-3 tablespoons, or 2 Dulcolax tablets. Call if you continue to have problems. If you  had been taking narcotics for pain, before, during or after your surgery, you may be constipated. Take a laxative if necessary.    Medication:  You should resume your pre-surgery medications unless told not to. In addition you may be given an antibiotic to prevent or treat infection. Antibiotics are not always necessary. All medication should be taken as prescribed until the bottles are finished unless you are having an unusual reaction to one of the drugs.

## 2018-08-30 NOTE — Transfer of Care (Signed)
Immediate Anesthesia Transfer of Care Note  Patient: Alexa Mcpherson  Procedure(s) Performed: CYSTOSCOPY/BIOPSY/HYDRODISTENSION AND FULGUURATION (N/A )  Patient Location: PACU  Anesthesia Type:General  Level of Consciousness: sedated  Airway & Oxygen Therapy: Patient Spontanous Breathing and Patient connected to face mask oxygen  Post-op Assessment: Report given to RN and Post -op Vital signs reviewed and stable  Post vital signs: Reviewed and stable  Last Vitals:  Vitals Value Taken Time  BP 145/62 08/30/2018 10:00 AM  Temp    Pulse 60 08/30/2018 10:08 AM  Resp 11 08/30/2018 10:08 AM  SpO2 94 % 08/30/2018 10:08 AM  Vitals shown include unvalidated device data.  Last Pain:  Vitals:   08/30/18 1000  TempSrc:   PainSc: 8       Patients Stated Pain Goal: 4 (36/62/94 [id number])  Complications: No apparent anesthesia complications

## 2018-08-30 NOTE — Anesthesia Procedure Notes (Signed)
Procedure Name: LMA Insertion Date/Time: 08/30/2018 8:43 AM Performed by: Talbot Grumbling, CRNA Pre-anesthesia Checklist: Patient identified, Emergency Drugs available, Suction available and Patient being monitored Patient Re-evaluated:Patient Re-evaluated prior to induction Oxygen Delivery Method: Circle system utilized Preoxygenation: Pre-oxygenation with 100% oxygen Induction Type: IV induction Ventilation: Mask ventilation without difficulty LMA: LMA inserted LMA Size: 4.0 Number of attempts: 1 Placement Confirmation: positive ETCO2 and breath sounds checked- equal and bilateral Tube secured with: Tape Dental Injury: Teeth and Oropharynx as per pre-operative assessment

## 2018-08-30 NOTE — Anesthesia Postprocedure Evaluation (Signed)
Anesthesia Post Note  Patient: Alexa Mcpherson  Procedure(s) Performed: CYSTOSCOPY/BIOPSY/HYDRODISTENSION AND FULGUURATION (N/A )     Patient location during evaluation: PACU Anesthesia Type: General Level of consciousness: awake and alert Pain management: pain level controlled Vital Signs Assessment: post-procedure vital signs reviewed and stable Respiratory status: spontaneous breathing, nonlabored ventilation, respiratory function stable and patient connected to nasal cannula oxygen Cardiovascular status: blood pressure returned to baseline and stable Postop Assessment: no apparent nausea or vomiting Anesthetic complications: no    Last Vitals:  Vitals:   08/30/18 1015 08/30/18 1035  BP: (!) 131/59 (!) 131/56  Pulse: 60 62  Resp: 14 15  Temp: 36.8 C 36.4 C  SpO2: 93% 95%    Last Pain:  Vitals:   08/30/18 1000  TempSrc:   PainSc: 8                  Tiajuana Amass

## 2018-08-30 NOTE — Op Note (Signed)
Operative Note  Preoperative diagnosis:  1.  Interstitial cystitis  Postoperative diagnosis: 1.  Interstitial cystitis  Procedure(s): 1.  Cystoscopy with hydrodistention 2.  Bladder biopsy and fulguration  Surgeon: Link Snuffer, MD  Assistants: None  Anesthesia: General  Complications: None immediate  EBL: Minimal  Specimens: 1.  Bladder biopsy x2  Drains/Catheters: 1.  None  Intraoperative findings: 1.  Normal urethra 2.  Diffusely friable mucosa.  Small bladder capacity.  Initial fill was 250 cc maximum capacity.  Capacity after hydrodistention was 300 cc. 3.  2 distinct areas of erythema and ulceration consistent with possible Hunner's ulcer that were biopsied and fulgurated these were located posteriorly.  Indication: 77 year old female with a history of interstitial cystitis that is refractory to medical management presents for the previously mentioned operation.  She has responded well in the past to hydrodistention.  Description of procedure:  The patient was identified and consent was obtained.  The patient was taken to the operating room and placed in the supine position.  The patient was placed under general anesthesia.  Perioperative antibiotics were administered.  The patient was placed in dorsal lithotomy.  Patient was prepped and draped in a standard sterile fashion and a timeout was performed.  A 21 French rigid cystoscope was advanced into the urethra and into the bladder.  Complete cystoscopy was performed with findings noted above.  The bladder was filled to maximum capacity and emptied.  Capacity was 250 cc.  I then reinserted the scope and filled to maximum capacity and held this at maximum capacity for 8 minutes.  The bladder was emptied and capacity was 300 cc.  I then reinserted the scope and biopsied the 2 ulcerated areas.  Passes off for specimen followed by fulguration of the ulcer beds as well as the surrounding areas of erythema.  I did not see any  other obvious ulcerations.  There was some friable mucosa laterally that I fulgurated.  There is no active bleeding after this.  I then drained the bladder and withdrew the scope.  This concluded the operation.  Patient tolerated the procedure well and was stable postoperatively.  Plan: Return in several weeks for symptomatic review.

## 2018-08-30 NOTE — H&P (Signed)
H&P   Chief Complaint: IC  History of Present Illness: 77 year old female with interstitial cystitis presents for hydrodistention with bladder biopsy and fulguration.  She has been having a significant amount of pain.  Past Medical History:  Diagnosis Date  . Carotid stenosis    left ICA 60% 04/2015 (1 yr f/u rec, Dr. Glenford Bayley)  . Chronic lower back pain    "frequency depends upon how active I am" (09/02/2015)  . Coronary artery disease   . Daily headache    "related to pinched nerve" (09/03/2015)  . Depression    takes Paxil daily  . Dizziness    r/t neck issues  . GERD (gastroesophageal reflux disease)    takes Omeprazole daily  . Heart murmur   . Hyperlipidemia    takes Simvastatin daily  . Hypertension   . Migraine    "stopped when I went thru the change"  . Neck pain   . Pneumonia 1940's X 1  . Shakes    takes Propranolol daily  . Tingling    weakness and numbness in left arm  . Type II diabetes mellitus (HCC)    takes Glipizide and Metformin;Fasting sugar 105-125   Past Surgical History:  Procedure Laterality Date  . ABDOMINAL HYSTERECTOMY    . ANTERIOR CERVICAL DECOMP/DISCECTOMY FUSION  09/03/2015   C5-6  . ANTERIOR CERVICAL DECOMP/DISCECTOMY FUSION Bilateral 09/03/2015   Procedure: C5-6 Anterior Cervical Discectomy and Fusion, Allograft, Plate ;  Surgeon: Marybelle Killings, MD;  Location: [place];  Service: Orthopedics;  Laterality: Bilateral;  . CARPAL TUNNEL RELEASE Right ?1980's  . CATARACT EXTRACTION, BILATERAL Bilateral 2013  . COLON SURGERY    . COLONOSCOPY    . CORONARY ARTERY BYPASS GRAFT  09/24/2014   CABG X 4  . CYSTOSCOPY WITH BIOPSY  12/14/2011   Procedure: CYSTOSCOPY WITH BIOPSY;  Surgeon: Marissa Nestle;  Location: [facility];  Service: Urology;  Laterality: N/A;  Cystoscopy with Bladder Biopsies, Bladder Fulguration  . CYSTOSCOPY WITH URETHRAL DILATATION  12/14/2011   Procedure: CYSTOSCOPY WITH URETHRAL DILATATION;  Surgeon: Marissa Nestle;  Location:  [facility];  Service: Urology;;  . ESOPHAGOGASTRODUODENOSCOPY    . LAPAROSCOPIC CHOLECYSTECTOMY  1990's  . TUBAL LIGATION  ~ 1968    Home Medications:  Medications Prior to Admission  Medication Sig Dispense Refill Last Dose  . [employer] ASPIRIN 325 MG tablet Take 325 mg by mouth daily.  0 07/22/2018  . [employer] MELATONIN 3 MG TABS Take 6 mg by mouth at bedtime.  11 08/28/2018  . glipiZIDE (GLUCOTROL) 5 MG tablet Take 5 mg by mouth at bedtime.    08/29/2018 at Unknown time  . HUMULIN 70/30 KWIKPEN (70-30) 100 UNIT/ML PEN Inject 20 Units into the skin 2 (two) times daily.  0 08/29/2018 at 2100  . hydrochlorothiazide (HYDRODIURIL) 25 MG tablet Take 25 mg by mouth 2 (two) times daily.  3 08/29/2018 at Unknown time  . lisinopril (PRINIVIL,ZESTRIL) 40 MG tablet Take 40 mg by mouth daily.  0 08/29/2018 at Unknown time  . metFORMIN (GLUCOPHAGE-XR) 500 MG 24 hr tablet Take 1,000 mg by mouth 2 (two) times daily.  0 08/29/2018 at Unknown time  . omeprazole (PRILOSEC) 40 MG capsule Take 40 mg by mouth daily.  0 08/29/2018 at Unknown time  . PARoxetine (PAXIL) 40 MG tablet Take 40 mg by mouth daily.    08/29/2018 at Unknown time  . primidone (MYSOLINE) 50 MG tablet Take 200 mg by mouth 2 (two) times daily.  5 08/30/2018 at 0400  . propranolol (INDERAL) 20 MG tablet Take 40 mg by mouth 2 (two) times daily.    08/30/2018 at 0400  . rosuvastatin (CRESTOR) 20 MG tablet Take 20 mg by mouth daily.  0 08/30/2018 at 0400  . tolterodine (DETROL LA) 4 MG 24 hr capsule Take 4 mg by mouth daily at 3 pm. (1600)  6 08/29/2018 at Unknown time  . cimetidine (TAGAMET) 400 MG tablet Take 400 mg by mouth 2 (two) times daily.   Unknown at Unknown time  . ibuprofen (ADVIL,MOTRIN) 200 MG tablet Take 400 mg by mouth every 8 (eight) hours as needed (for pain).   Unknown at Unknown time   Allergies: No Known Allergies  History reviewed. No pertinent family history. Social History:  reports that she has never smoked. She has never used smokeless tobacco. She  reports that she does not drink alcohol or use drugs.  ROS: A complete review of systems was performed.  All systems are negative except for pertinent findings as noted. ROS   Physical Exam:  Vital signs in last 24 hours: Temp:  [97.6 F (36.4 C)] 97.6 F (36.4 C) (09/04 0932) Pulse Rate:  [58] 58 (09/04 0632) Resp:  [16] 16 (09/04 6712) BP: (174)/(52) 174/52 (09/04 0632) SpO2:  [100 %] 100 % (09/04 4580) Weight:  [71.7 kg] 71.7 kg (09/04 0729) General:  Alert and oriented, No acute distress HEENT: Normocephalic, atraumatic Neck: No JVD or lymphadenopathy Cardiovascular: Regular rate and rhythm Lungs: Regular rate and effort Abdomen: Soft, nontender, nondistended, no abdominal masses Back: No CVA tenderness Extremities: No edema Neurologic: Grossly intact  Laboratory Data:  Results for orders placed or performed during the hospital encounter of 08/30/18 (from the past 24 hour(s))  Glucose, capillary     Status: Abnormal   Collection Time: 08/30/18  7:56 AM  Result Value Ref Range   Glucose-Capillary 112 (H) 70 - 99 mg/dL   No results found for this or any previous visit (from the past 240 hour(s)). Creatinine: Recent Labs    08/24/18 1139  CREATININE 0.91    Impression/Assessment:  Interstitial cystitis  Plan:  Proceed with hydrodistention with bladder biopsy and fulguration.  Marton Redwood, III 08/30/2018, 8:28 AM

## 2018-08-31 ENCOUNTER — Encounter (HOSPITAL_COMMUNITY): Payer: Self-pay | Admitting: Urology

## 2018-09-11 DIAGNOSIS — R9431 Abnormal electrocardiogram [ECG] [EKG]: Secondary | ICD-10-CM | POA: Diagnosis not present

## 2018-09-11 DIAGNOSIS — Z951 Presence of aortocoronary bypass graft: Secondary | ICD-10-CM | POA: Diagnosis not present

## 2018-09-11 DIAGNOSIS — I10 Essential (primary) hypertension: Secondary | ICD-10-CM | POA: Diagnosis not present

## 2018-09-11 DIAGNOSIS — I2510 Atherosclerotic heart disease of native coronary artery without angina pectoris: Secondary | ICD-10-CM | POA: Diagnosis not present

## 2018-09-11 DIAGNOSIS — I6523 Occlusion and stenosis of bilateral carotid arteries: Secondary | ICD-10-CM | POA: Diagnosis not present

## 2018-09-11 DIAGNOSIS — R001 Bradycardia, unspecified: Secondary | ICD-10-CM | POA: Diagnosis not present

## 2018-09-14 DIAGNOSIS — N3010 Interstitial cystitis (chronic) without hematuria: Secondary | ICD-10-CM | POA: Diagnosis not present

## 2018-09-25 DIAGNOSIS — I10 Essential (primary) hypertension: Secondary | ICD-10-CM | POA: Diagnosis not present

## 2018-09-25 DIAGNOSIS — K210 Gastro-esophageal reflux disease with esophagitis: Secondary | ICD-10-CM | POA: Diagnosis not present

## 2018-09-25 DIAGNOSIS — E1165 Type 2 diabetes mellitus with hyperglycemia: Secondary | ICD-10-CM | POA: Diagnosis not present

## 2018-09-25 DIAGNOSIS — E7849 Other hyperlipidemia: Secondary | ICD-10-CM | POA: Diagnosis not present

## 2018-09-25 DIAGNOSIS — G20 Parkinson's disease: Secondary | ICD-10-CM | POA: Diagnosis not present

## 2018-10-18 DIAGNOSIS — G20 Parkinson's disease: Secondary | ICD-10-CM | POA: Diagnosis not present

## 2018-10-18 DIAGNOSIS — E1165 Type 2 diabetes mellitus with hyperglycemia: Secondary | ICD-10-CM | POA: Diagnosis not present

## 2018-10-18 DIAGNOSIS — I10 Essential (primary) hypertension: Secondary | ICD-10-CM | POA: Diagnosis not present

## 2018-10-18 DIAGNOSIS — E7849 Other hyperlipidemia: Secondary | ICD-10-CM | POA: Diagnosis not present

## 2018-11-01 DIAGNOSIS — K210 Gastro-esophageal reflux disease with esophagitis: Secondary | ICD-10-CM | POA: Diagnosis not present

## 2018-11-01 DIAGNOSIS — E7849 Other hyperlipidemia: Secondary | ICD-10-CM | POA: Diagnosis not present

## 2018-11-01 DIAGNOSIS — I10 Essential (primary) hypertension: Secondary | ICD-10-CM | POA: Diagnosis not present

## 2018-11-01 DIAGNOSIS — E1165 Type 2 diabetes mellitus with hyperglycemia: Secondary | ICD-10-CM | POA: Diagnosis not present

## 2018-11-02 DIAGNOSIS — R9439 Abnormal result of other cardiovascular function study: Secondary | ICD-10-CM | POA: Diagnosis not present

## 2018-11-02 DIAGNOSIS — I6523 Occlusion and stenosis of bilateral carotid arteries: Secondary | ICD-10-CM | POA: Diagnosis not present

## 2018-11-02 DIAGNOSIS — I10 Essential (primary) hypertension: Secondary | ICD-10-CM | POA: Diagnosis not present

## 2018-11-02 DIAGNOSIS — Z951 Presence of aortocoronary bypass graft: Secondary | ICD-10-CM | POA: Diagnosis not present

## 2018-11-02 DIAGNOSIS — I2510 Atherosclerotic heart disease of native coronary artery without angina pectoris: Secondary | ICD-10-CM | POA: Diagnosis not present

## 2018-11-08 DIAGNOSIS — R011 Cardiac murmur, unspecified: Secondary | ICD-10-CM | POA: Diagnosis not present

## 2018-11-08 DIAGNOSIS — E785 Hyperlipidemia, unspecified: Secondary | ICD-10-CM | POA: Diagnosis not present

## 2018-11-08 DIAGNOSIS — I6522 Occlusion and stenosis of left carotid artery: Secondary | ICD-10-CM | POA: Diagnosis not present

## 2018-11-08 DIAGNOSIS — Z951 Presence of aortocoronary bypass graft: Secondary | ICD-10-CM | POA: Diagnosis not present

## 2018-11-08 DIAGNOSIS — I2510 Atherosclerotic heart disease of native coronary artery without angina pectoris: Secondary | ICD-10-CM | POA: Diagnosis not present

## 2018-11-08 DIAGNOSIS — K219 Gastro-esophageal reflux disease without esophagitis: Secondary | ICD-10-CM | POA: Diagnosis not present

## 2018-11-08 DIAGNOSIS — I10 Essential (primary) hypertension: Secondary | ICD-10-CM | POA: Diagnosis not present

## 2018-11-08 DIAGNOSIS — R9439 Abnormal result of other cardiovascular function study: Secondary | ICD-10-CM | POA: Diagnosis not present

## 2018-11-08 DIAGNOSIS — I25810 Atherosclerosis of coronary artery bypass graft(s) without angina pectoris: Secondary | ICD-10-CM | POA: Diagnosis not present

## 2018-11-08 DIAGNOSIS — I2584 Coronary atherosclerosis due to calcified coronary lesion: Secondary | ICD-10-CM | POA: Diagnosis not present

## 2018-11-08 DIAGNOSIS — D509 Iron deficiency anemia, unspecified: Secondary | ICD-10-CM | POA: Diagnosis not present

## 2018-11-08 DIAGNOSIS — E119 Type 2 diabetes mellitus without complications: Secondary | ICD-10-CM | POA: Diagnosis not present

## 2018-11-08 DIAGNOSIS — G250 Essential tremor: Secondary | ICD-10-CM | POA: Diagnosis not present

## 2018-11-08 DIAGNOSIS — Z8249 Family history of ischemic heart disease and other diseases of the circulatory system: Secondary | ICD-10-CM | POA: Diagnosis not present

## 2018-11-08 DIAGNOSIS — I2582 Chronic total occlusion of coronary artery: Secondary | ICD-10-CM | POA: Diagnosis not present

## 2018-12-04 DIAGNOSIS — Z951 Presence of aortocoronary bypass graft: Secondary | ICD-10-CM | POA: Diagnosis not present

## 2018-12-04 DIAGNOSIS — I10 Essential (primary) hypertension: Secondary | ICD-10-CM | POA: Diagnosis not present

## 2018-12-04 DIAGNOSIS — I2510 Atherosclerotic heart disease of native coronary artery without angina pectoris: Secondary | ICD-10-CM | POA: Diagnosis not present

## 2018-12-04 DIAGNOSIS — I6523 Occlusion and stenosis of bilateral carotid arteries: Secondary | ICD-10-CM | POA: Diagnosis not present

## 2019-01-03 DIAGNOSIS — E1165 Type 2 diabetes mellitus with hyperglycemia: Secondary | ICD-10-CM | POA: Diagnosis not present

## 2019-01-03 DIAGNOSIS — E7849 Other hyperlipidemia: Secondary | ICD-10-CM | POA: Diagnosis not present

## 2019-01-03 DIAGNOSIS — I10 Essential (primary) hypertension: Secondary | ICD-10-CM | POA: Diagnosis not present

## 2019-01-03 DIAGNOSIS — K210 Gastro-esophageal reflux disease with esophagitis: Secondary | ICD-10-CM | POA: Diagnosis not present

## 2019-01-04 ENCOUNTER — Ambulatory Visit (INDEPENDENT_AMBULATORY_CARE_PROVIDER_SITE_OTHER): Payer: Medicare Other | Admitting: Orthopaedic Surgery

## 2019-01-04 ENCOUNTER — Encounter (INDEPENDENT_AMBULATORY_CARE_PROVIDER_SITE_OTHER): Payer: Self-pay | Admitting: Orthopaedic Surgery

## 2019-01-04 VITALS — BP 113/61 | HR 66 | Ht 64.0 in | Wt 160.0 lb

## 2019-01-04 DIAGNOSIS — M25561 Pain in right knee: Secondary | ICD-10-CM

## 2019-01-04 DIAGNOSIS — M65969 Unspecified synovitis and tenosynovitis, unspecified lower leg: Secondary | ICD-10-CM

## 2019-01-04 DIAGNOSIS — M659 Synovitis and tenosynovitis, unspecified: Secondary | ICD-10-CM | POA: Diagnosis not present

## 2019-01-04 MED ORDER — LIDOCAINE HCL 1 % IJ SOLN
0.5000 mL | INTRAMUSCULAR | Status: AC | PRN
  Administered 2019-01-04: .5 mL

## 2019-01-04 MED ORDER — METHYLPREDNISOLONE ACETATE 40 MG/ML IJ SUSP
40.0000 mg | INTRAMUSCULAR | Status: AC | PRN
  Administered 2019-01-04: 40 mg via INTRA_ARTICULAR

## 2019-01-04 MED ORDER — BUPIVACAINE HCL 0.25 % IJ SOLN
4.0000 mL | INTRAMUSCULAR | Status: AC | PRN
  Administered 2019-01-04: 4 mL via INTRA_ARTICULAR

## 2019-01-04 NOTE — Progress Notes (Signed)
Office Visit Note   Patient: Alexa Mcpherson           Date of Birth: 07/18/1941           MRN: [id number] Visit Date: 01/04/2019              Requested by: Neale Burly, MD [place], [place] [id number] PCP: Neale Burly, MD   Assessment & Plan: Visit Diagnoses:  1. Synovitis of knee     Plan: Knee injection performed with significant improvement in her ability to ambulate and improvement in her pain.  We will check her back again in 2 weeks if she is having ongoing problems will obtain radiographs of the right knee including standing AP both knees lateral right knee and sunrise patellar x-ray.  Follow-Up Instructions: Return in about 2 weeks (around 01/18/2019).   Orders:  Orders Placed This Encounter  Procedures  . Large Joint Inj: R knee   No orders of the defined types were placed in this encounter.     Procedures: Large Joint Inj: R knee on 01/04/2019 11:05 AM Indications: pain and joint swelling Details: 22 G 1.5 in needle, anterolateral approach  Arthrogram: No  Medications: 40 mg methylPREDNISolone acetate 40 MG/ML; 0.5 mL lidocaine 1 %; 4 mL bupivacaine 0.25 % Outcome: tolerated well, no immediate complications Procedure, treatment alternatives, risks and benefits explained, specific risks discussed. Consent was given by the patient. Immediately prior to procedure a time out was called to verify the correct patient, procedure, equipment, support staff and site/side marked as required. Patient was prepped and draped in the usual sterile fashion.       Clinical Data: No additional findings.   Subjective: Chief Complaint  Patient presents with  . Right Knee - Pain    HPI 78 year old female seen with right knee pain that radiates laterally into the anterior compartment stops short of the ankle.  She states it started a few days after heart catheterization and she states she is having difficulty walking and walks with a right knee flexed  knee gait with minimal contact weight loading on her heel.  Pain bothers her at night she has trouble sleeping it wakes her up at night.  She denies locking.  She gets relief with nonweightbearing.  Patient is a diabetic on 70/30 insulin 20 units twice a day and checks her sugars several times a day.  Review of Systems 10 point view of systems positive for diabetes on insulin.  Hypertension, high cholesterol carotid stenosis, depression, vertigo, GERD, history of migraines.  History of pneumonia.  Coronary artery disease.   Objective: Vital Signs: BP 113/61   Pulse 66   Ht 5\' 4"  (1.626 m)   Wt 160 lb (72.6 kg)   BMI 27.46 kg/m   Physical Exam Constitutional:      Appearance: She is well-developed.  HENT:     Head: Normocephalic.     Right Ear: External ear normal.     Left Ear: External ear normal.  Eyes:     Pupils: Pupils are equal, round, and reactive to light.  Neck:     Thyroid: No thyromegaly.     Trachea: No tracheal deviation.  Cardiovascular:     Rate and Rhythm: Normal rate.  Pulmonary:     Effort: Pulmonary effort is normal.  Abdominal:     Palpations: Abdomen is soft.  Skin:    General: Skin is warm and dry.  Neurological:     Mental  Status: She is alert and oriented to person, place, and time.  Psychiatric:        Behavior: Behavior normal.     Ortho Exam patient has more lateral than medial joint line pain.  Pain with hyperextension.  She is amatory with a right knee limp.  Cruciate ligament and collateral ligament exam is normal.  Distal pulses are intact.  Knee and ankle jerk are intact negative logroll to the hips.  Specialty Comments:  No specialty comments available.  Imaging: No results found.   PMFS History: Patient Active Problem List   Diagnosis Date Noted  . S/P cervical spinal fusion 09/03/2015   Past Medical History:  Diagnosis Date  . Carotid stenosis    left ICA 60% 04/2015 (1 yr f/u rec, Dr. Glenford Bayley)  . Chronic lower back pain     "frequency depends upon how active I am" (09/02/2015)  . Coronary artery disease   . Daily headache    "related to pinched nerve" (09/03/2015)  . Depression    takes Paxil daily  . Dizziness    r/t neck issues  . GERD (gastroesophageal reflux disease)    takes Omeprazole daily  . Heart murmur   . Hyperlipidemia    takes Simvastatin daily  . Hypertension   . Migraine    "stopped when I went thru the change"  . Neck pain   . Pneumonia 1940's X 1  . Shakes    takes Propranolol daily  . Tingling    weakness and numbness in left arm  . Type II diabetes mellitus (HCC)    takes Glipizide and Metformin;Fasting sugar 105-125    No family history on file.  Past Surgical History:  Procedure Laterality Date  . ABDOMINAL HYSTERECTOMY    . ANTERIOR CERVICAL DECOMP/DISCECTOMY FUSION  09/03/2015   C5-6  . ANTERIOR CERVICAL DECOMP/DISCECTOMY FUSION Bilateral 09/03/2015   Procedure: C5-6 Anterior Cervical Discectomy and Fusion, Allograft, Plate ;  Surgeon: Marybelle Killings, MD;  Location: [facility];  Service: Orthopedics;  Laterality: Bilateral;  . CARPAL TUNNEL RELEASE Right ?1980's  . CATARACT EXTRACTION, BILATERAL Bilateral 2013  . COLON SURGERY    . COLONOSCOPY    . CORONARY ARTERY BYPASS GRAFT  09/24/2014   CABG X 4  . CYSTOSCOPY WITH BIOPSY  12/14/2011   Procedure: CYSTOSCOPY WITH BIOPSY;  Surgeon: Marissa Nestle;  Location: [facility];  Service: Urology;  Laterality: N/A;  Cystoscopy with Bladder Biopsies, Bladder Fulguration  . CYSTOSCOPY WITH HYDRODISTENSION AND BIOPSY N/A 08/30/2018   Procedure: CYSTOSCOPY/BIOPSY/HYDRODISTENSION AND FULGUURATION;  Surgeon: Lucas Mallow, MD;  Location: [facility];  Service: Urology;  Laterality: N/A;  . CYSTOSCOPY WITH URETHRAL DILATATION  12/14/2011   Procedure: CYSTOSCOPY WITH URETHRAL DILATATION;  Surgeon: Marissa Nestle;  Location: [facility];  Service: Urology;;  . ESOPHAGOGASTRODUODENOSCOPY    . LAPAROSCOPIC CHOLECYSTECTOMY  1990's  . TUBAL LIGATION  ~  1968   Social History   Occupational History  . Not on file  Tobacco Use  . Smoking status: Never Smoker  . Smokeless tobacco: Never Used  Substance and Sexual Activity  . Alcohol use: No  . Drug use: No  . Sexual activity: Not Currently    Birth control/protection: Post-menopausal

## 2019-01-18 DIAGNOSIS — E1165 Type 2 diabetes mellitus with hyperglycemia: Secondary | ICD-10-CM | POA: Diagnosis not present

## 2019-01-18 DIAGNOSIS — I10 Essential (primary) hypertension: Secondary | ICD-10-CM | POA: Diagnosis not present

## 2019-01-18 DIAGNOSIS — G20 Parkinson's disease: Secondary | ICD-10-CM | POA: Diagnosis not present

## 2019-01-18 DIAGNOSIS — E7849 Other hyperlipidemia: Secondary | ICD-10-CM | POA: Diagnosis not present

## 2019-01-18 DIAGNOSIS — Z0000 Encounter for general adult medical examination without abnormal findings: Secondary | ICD-10-CM | POA: Diagnosis not present

## 2019-01-18 DIAGNOSIS — K210 Gastro-esophageal reflux disease with esophagitis: Secondary | ICD-10-CM | POA: Diagnosis not present

## 2019-01-22 DIAGNOSIS — I6522 Occlusion and stenosis of left carotid artery: Secondary | ICD-10-CM | POA: Diagnosis not present

## 2019-01-22 DIAGNOSIS — I6523 Occlusion and stenosis of bilateral carotid arteries: Secondary | ICD-10-CM | POA: Diagnosis not present

## 2019-01-23 DIAGNOSIS — M5431 Sciatica, right side: Secondary | ICD-10-CM | POA: Diagnosis not present

## 2019-01-23 DIAGNOSIS — M7138 Other bursal cyst, other site: Secondary | ICD-10-CM | POA: Diagnosis not present

## 2019-01-23 DIAGNOSIS — M48061 Spinal stenosis, lumbar region without neurogenic claudication: Secondary | ICD-10-CM | POA: Diagnosis not present

## 2019-01-23 DIAGNOSIS — M47816 Spondylosis without myelopathy or radiculopathy, lumbar region: Secondary | ICD-10-CM | POA: Diagnosis not present

## 2019-01-23 DIAGNOSIS — M5137 Other intervertebral disc degeneration, lumbosacral region: Secondary | ICD-10-CM | POA: Diagnosis not present

## 2019-01-25 ENCOUNTER — Ambulatory Visit (INDEPENDENT_AMBULATORY_CARE_PROVIDER_SITE_OTHER): Payer: Medicare Other | Admitting: Orthopaedic Surgery

## 2019-02-01 ENCOUNTER — Other Ambulatory Visit: Payer: Self-pay | Admitting: Internal Medicine

## 2019-02-01 DIAGNOSIS — M545 Low back pain: Principal | ICD-10-CM

## 2019-02-01 DIAGNOSIS — G8929 Other chronic pain: Secondary | ICD-10-CM

## 2019-02-01 DIAGNOSIS — Z1231 Encounter for screening mammogram for malignant neoplasm of breast: Secondary | ICD-10-CM | POA: Diagnosis not present

## 2019-02-05 ENCOUNTER — Ambulatory Visit
Admission: RE | Admit: 2019-02-05 | Discharge: 2019-02-05 | Disposition: A | Discharge status: Home / Self Care | Payer: Medicare Other | Source: Ambulatory Visit | Attending: Internal Medicine | Admitting: Internal Medicine

## 2019-02-05 ENCOUNTER — Other Ambulatory Visit: Payer: Self-pay | Admitting: Internal Medicine

## 2019-02-05 DIAGNOSIS — M545 Low back pain, unspecified: Secondary | ICD-10-CM

## 2019-02-05 DIAGNOSIS — G8929 Other chronic pain: Secondary | ICD-10-CM

## 2019-02-05 DIAGNOSIS — M48061 Spinal stenosis, lumbar region without neurogenic claudication: Secondary | ICD-10-CM | POA: Diagnosis not present

## 2019-02-05 MED ORDER — IOPAMIDOL (ISOVUE-M 200) INJECTION 41%
1.0000 mL | Freq: Once | INTRAMUSCULAR | Status: AC
  Administered 2019-02-05: 1 mL via EPIDURAL

## 2019-02-05 MED ORDER — METHYLPREDNISOLONE ACETATE 40 MG/ML INJ SUSP (RADIOLOG
120.0000 mg | Freq: Once | INTRAMUSCULAR | Status: AC
  Administered 2019-02-05: 120 mg via EPIDURAL

## 2019-02-05 NOTE — Discharge Instructions (Signed)

## 2019-02-09 DIAGNOSIS — R251 Tremor, unspecified: Secondary | ICD-10-CM | POA: Diagnosis not present

## 2019-02-09 DIAGNOSIS — G250 Essential tremor: Secondary | ICD-10-CM | POA: Diagnosis not present

## 2019-02-09 DIAGNOSIS — G20 Parkinson's disease: Secondary | ICD-10-CM | POA: Diagnosis not present

## 2019-02-09 DIAGNOSIS — Z79899 Other long term (current) drug therapy: Secondary | ICD-10-CM | POA: Diagnosis not present

## 2019-02-26 DIAGNOSIS — M8588 Other specified disorders of bone density and structure, other site: Secondary | ICD-10-CM | POA: Diagnosis not present

## 2019-02-26 DIAGNOSIS — Z780 Asymptomatic menopausal state: Secondary | ICD-10-CM | POA: Diagnosis not present

## 2019-02-26 DIAGNOSIS — M810 Age-related osteoporosis without current pathological fracture: Secondary | ICD-10-CM | POA: Diagnosis not present

## 2019-03-02 DIAGNOSIS — G20 Parkinson's disease: Secondary | ICD-10-CM | POA: Diagnosis not present

## 2019-03-02 DIAGNOSIS — E1165 Type 2 diabetes mellitus with hyperglycemia: Secondary | ICD-10-CM | POA: Diagnosis not present

## 2019-03-02 DIAGNOSIS — I10 Essential (primary) hypertension: Secondary | ICD-10-CM | POA: Diagnosis not present

## 2019-03-02 DIAGNOSIS — K210 Gastro-esophageal reflux disease with esophagitis: Secondary | ICD-10-CM | POA: Diagnosis not present

## 2019-03-02 DIAGNOSIS — E7849 Other hyperlipidemia: Secondary | ICD-10-CM | POA: Diagnosis not present

## 2019-03-26 DIAGNOSIS — N3091 Cystitis, unspecified with hematuria: Secondary | ICD-10-CM | POA: Diagnosis not present

## 2019-04-11 DIAGNOSIS — E785 Hyperlipidemia, unspecified: Secondary | ICD-10-CM | POA: Diagnosis not present

## 2019-04-11 DIAGNOSIS — Z0000 Encounter for general adult medical examination without abnormal findings: Secondary | ICD-10-CM | POA: Diagnosis not present

## 2019-04-11 DIAGNOSIS — E1165 Type 2 diabetes mellitus with hyperglycemia: Secondary | ICD-10-CM | POA: Diagnosis not present

## 2019-04-11 DIAGNOSIS — I10 Essential (primary) hypertension: Secondary | ICD-10-CM | POA: Diagnosis not present

## 2019-04-11 DIAGNOSIS — Z1389 Encounter for screening for other disorder: Secondary | ICD-10-CM | POA: Diagnosis not present

## 2019-05-22 DIAGNOSIS — I10 Essential (primary) hypertension: Secondary | ICD-10-CM | POA: Diagnosis not present

## 2019-05-22 DIAGNOSIS — E785 Hyperlipidemia, unspecified: Secondary | ICD-10-CM | POA: Diagnosis not present

## 2019-05-22 DIAGNOSIS — E1165 Type 2 diabetes mellitus with hyperglycemia: Secondary | ICD-10-CM | POA: Diagnosis not present

## 2019-05-29 DIAGNOSIS — N951 Menopausal and female climacteric states: Secondary | ICD-10-CM | POA: Diagnosis not present

## 2019-06-19 DIAGNOSIS — I10 Essential (primary) hypertension: Secondary | ICD-10-CM | POA: Diagnosis not present

## 2019-06-19 DIAGNOSIS — E785 Hyperlipidemia, unspecified: Secondary | ICD-10-CM | POA: Diagnosis not present

## 2019-06-19 DIAGNOSIS — E1165 Type 2 diabetes mellitus with hyperglycemia: Secondary | ICD-10-CM | POA: Diagnosis not present

## 2019-06-30 DIAGNOSIS — S61213A Laceration without foreign body of left middle finger without damage to nail, initial encounter: Secondary | ICD-10-CM | POA: Diagnosis not present

## 2019-06-30 DIAGNOSIS — Z794 Long term (current) use of insulin: Secondary | ICD-10-CM | POA: Diagnosis not present

## 2019-06-30 DIAGNOSIS — S61216A Laceration without foreign body of right little finger without damage to nail, initial encounter: Secondary | ICD-10-CM | POA: Diagnosis not present

## 2019-06-30 DIAGNOSIS — E119 Type 2 diabetes mellitus without complications: Secondary | ICD-10-CM | POA: Diagnosis not present

## 2019-06-30 DIAGNOSIS — W182XXA Fall in (into) shower or empty bathtub, initial encounter: Secondary | ICD-10-CM | POA: Diagnosis not present

## 2019-06-30 DIAGNOSIS — Z7982 Long term (current) use of aspirin: Secondary | ICD-10-CM | POA: Diagnosis not present

## 2019-06-30 DIAGNOSIS — S6991XA Unspecified injury of right wrist, hand and finger(s), initial encounter: Secondary | ICD-10-CM | POA: Diagnosis not present

## 2019-06-30 DIAGNOSIS — Z79899 Other long term (current) drug therapy: Secondary | ICD-10-CM | POA: Diagnosis not present

## 2019-06-30 DIAGNOSIS — Z951 Presence of aortocoronary bypass graft: Secondary | ICD-10-CM | POA: Diagnosis not present

## 2019-06-30 DIAGNOSIS — K219 Gastro-esophageal reflux disease without esophagitis: Secondary | ICD-10-CM | POA: Diagnosis not present

## 2019-06-30 DIAGNOSIS — M79644 Pain in right finger(s): Secondary | ICD-10-CM | POA: Diagnosis not present

## 2019-06-30 DIAGNOSIS — Z23 Encounter for immunization: Secondary | ICD-10-CM | POA: Diagnosis not present

## 2019-06-30 DIAGNOSIS — I10 Essential (primary) hypertension: Secondary | ICD-10-CM | POA: Diagnosis not present

## 2019-07-02 DIAGNOSIS — X58XXXD Exposure to other specified factors, subsequent encounter: Secondary | ICD-10-CM | POA: Diagnosis not present

## 2019-07-02 DIAGNOSIS — Z4800 Encounter for change or removal of nonsurgical wound dressing: Secondary | ICD-10-CM | POA: Diagnosis not present

## 2019-07-02 DIAGNOSIS — S61218D Laceration without foreign body of other finger without damage to nail, subsequent encounter: Secondary | ICD-10-CM | POA: Diagnosis not present

## 2019-07-02 DIAGNOSIS — S61216D Laceration without foreign body of right little finger without damage to nail, subsequent encounter: Secondary | ICD-10-CM | POA: Diagnosis not present

## 2019-07-12 DIAGNOSIS — N2889 Other specified disorders of kidney and ureter: Secondary | ICD-10-CM | POA: Diagnosis not present

## 2019-07-12 DIAGNOSIS — E119 Type 2 diabetes mellitus without complications: Secondary | ICD-10-CM | POA: Diagnosis not present

## 2019-07-12 DIAGNOSIS — R0789 Other chest pain: Secondary | ICD-10-CM | POA: Diagnosis not present

## 2019-07-12 DIAGNOSIS — Z7982 Long term (current) use of aspirin: Secondary | ICD-10-CM | POA: Diagnosis not present

## 2019-07-12 DIAGNOSIS — R251 Tremor, unspecified: Secondary | ICD-10-CM | POA: Diagnosis not present

## 2019-07-12 DIAGNOSIS — R911 Solitary pulmonary nodule: Secondary | ICD-10-CM | POA: Diagnosis not present

## 2019-07-12 DIAGNOSIS — K219 Gastro-esophageal reflux disease without esophagitis: Secondary | ICD-10-CM | POA: Diagnosis not present

## 2019-07-12 DIAGNOSIS — Z9049 Acquired absence of other specified parts of digestive tract: Secondary | ICD-10-CM | POA: Diagnosis not present

## 2019-07-12 DIAGNOSIS — Z888 Allergy status to other drugs, medicaments and biological substances status: Secondary | ICD-10-CM | POA: Diagnosis not present

## 2019-07-12 DIAGNOSIS — R109 Unspecified abdominal pain: Secondary | ICD-10-CM | POA: Diagnosis not present

## 2019-07-12 DIAGNOSIS — R55 Syncope and collapse: Secondary | ICD-10-CM | POA: Diagnosis not present

## 2019-07-12 DIAGNOSIS — Z780 Asymptomatic menopausal state: Secondary | ICD-10-CM | POA: Diagnosis not present

## 2019-07-12 DIAGNOSIS — Z79899 Other long term (current) drug therapy: Secondary | ICD-10-CM | POA: Diagnosis not present

## 2019-07-12 DIAGNOSIS — E785 Hyperlipidemia, unspecified: Secondary | ICD-10-CM | POA: Diagnosis not present

## 2019-07-12 DIAGNOSIS — R531 Weakness: Secondary | ICD-10-CM | POA: Diagnosis not present

## 2019-07-12 DIAGNOSIS — I10 Essential (primary) hypertension: Secondary | ICD-10-CM | POA: Diagnosis not present

## 2019-07-12 DIAGNOSIS — Z794 Long term (current) use of insulin: Secondary | ICD-10-CM | POA: Diagnosis not present

## 2019-07-12 DIAGNOSIS — R0602 Shortness of breath: Secondary | ICD-10-CM | POA: Diagnosis not present

## 2019-07-12 DIAGNOSIS — M545 Low back pain: Secondary | ICD-10-CM | POA: Diagnosis not present

## 2019-07-13 DIAGNOSIS — R531 Weakness: Secondary | ICD-10-CM | POA: Diagnosis not present

## 2019-07-13 DIAGNOSIS — R251 Tremor, unspecified: Secondary | ICD-10-CM | POA: Diagnosis not present

## 2019-07-13 DIAGNOSIS — R109 Unspecified abdominal pain: Secondary | ICD-10-CM | POA: Diagnosis not present

## 2019-07-13 DIAGNOSIS — Z79899 Other long term (current) drug therapy: Secondary | ICD-10-CM | POA: Diagnosis not present

## 2019-07-13 DIAGNOSIS — Z9049 Acquired absence of other specified parts of digestive tract: Secondary | ICD-10-CM | POA: Diagnosis not present

## 2019-07-13 DIAGNOSIS — K219 Gastro-esophageal reflux disease without esophagitis: Secondary | ICD-10-CM | POA: Diagnosis not present

## 2019-07-13 DIAGNOSIS — R0789 Other chest pain: Secondary | ICD-10-CM | POA: Diagnosis not present

## 2019-07-13 DIAGNOSIS — Z780 Asymptomatic menopausal state: Secondary | ICD-10-CM | POA: Diagnosis not present

## 2019-07-13 DIAGNOSIS — R0602 Shortness of breath: Secondary | ICD-10-CM | POA: Diagnosis not present

## 2019-07-13 DIAGNOSIS — E119 Type 2 diabetes mellitus without complications: Secondary | ICD-10-CM | POA: Diagnosis not present

## 2019-07-13 DIAGNOSIS — I10 Essential (primary) hypertension: Secondary | ICD-10-CM | POA: Diagnosis not present

## 2019-07-13 DIAGNOSIS — Z888 Allergy status to other drugs, medicaments and biological substances status: Secondary | ICD-10-CM | POA: Diagnosis not present

## 2019-07-13 DIAGNOSIS — Z7982 Long term (current) use of aspirin: Secondary | ICD-10-CM | POA: Diagnosis not present

## 2019-07-13 DIAGNOSIS — R55 Syncope and collapse: Secondary | ICD-10-CM | POA: Diagnosis not present

## 2019-07-13 DIAGNOSIS — E785 Hyperlipidemia, unspecified: Secondary | ICD-10-CM | POA: Diagnosis not present

## 2019-07-13 DIAGNOSIS — M545 Low back pain: Secondary | ICD-10-CM | POA: Diagnosis not present

## 2019-07-13 DIAGNOSIS — Z794 Long term (current) use of insulin: Secondary | ICD-10-CM | POA: Diagnosis not present

## 2019-07-16 DIAGNOSIS — E1165 Type 2 diabetes mellitus with hyperglycemia: Secondary | ICD-10-CM | POA: Diagnosis not present

## 2019-07-16 DIAGNOSIS — I10 Essential (primary) hypertension: Secondary | ICD-10-CM | POA: Diagnosis not present

## 2019-07-16 DIAGNOSIS — E785 Hyperlipidemia, unspecified: Secondary | ICD-10-CM | POA: Diagnosis not present

## 2019-07-25 DIAGNOSIS — R5383 Other fatigue: Secondary | ICD-10-CM | POA: Diagnosis not present

## 2019-07-25 DIAGNOSIS — R55 Syncope and collapse: Secondary | ICD-10-CM | POA: Diagnosis not present

## 2019-08-01 DIAGNOSIS — I10 Essential (primary) hypertension: Secondary | ICD-10-CM | POA: Diagnosis not present

## 2019-08-01 DIAGNOSIS — Z789 Other specified health status: Secondary | ICD-10-CM | POA: Diagnosis not present

## 2019-08-01 DIAGNOSIS — R634 Abnormal weight loss: Secondary | ICD-10-CM | POA: Insufficient documentation

## 2019-08-01 DIAGNOSIS — R251 Tremor, unspecified: Secondary | ICD-10-CM | POA: Diagnosis not present

## 2019-08-01 DIAGNOSIS — R109 Unspecified abdominal pain: Secondary | ICD-10-CM | POA: Insufficient documentation

## 2019-08-01 DIAGNOSIS — R1909 Other intra-abdominal and pelvic swelling, mass and lump: Secondary | ICD-10-CM | POA: Diagnosis not present

## 2019-08-01 DIAGNOSIS — E119 Type 2 diabetes mellitus without complications: Secondary | ICD-10-CM | POA: Diagnosis not present

## 2019-08-01 DIAGNOSIS — G250 Essential tremor: Secondary | ICD-10-CM | POA: Diagnosis not present

## 2019-08-01 DIAGNOSIS — R5383 Other fatigue: Secondary | ICD-10-CM | POA: Diagnosis not present

## 2019-08-09 DIAGNOSIS — H524 Presbyopia: Secondary | ICD-10-CM | POA: Diagnosis not present

## 2019-08-09 DIAGNOSIS — H40013 Open angle with borderline findings, low risk, bilateral: Secondary | ICD-10-CM | POA: Diagnosis not present

## 2019-08-14 DIAGNOSIS — I10 Essential (primary) hypertension: Secondary | ICD-10-CM | POA: Diagnosis not present

## 2019-08-14 DIAGNOSIS — K210 Gastro-esophageal reflux disease with esophagitis: Secondary | ICD-10-CM | POA: Diagnosis not present

## 2019-08-14 DIAGNOSIS — E1165 Type 2 diabetes mellitus with hyperglycemia: Secondary | ICD-10-CM | POA: Diagnosis not present

## 2019-08-15 DIAGNOSIS — R5383 Other fatigue: Secondary | ICD-10-CM | POA: Diagnosis not present

## 2019-08-15 DIAGNOSIS — G20 Parkinson's disease: Secondary | ICD-10-CM | POA: Diagnosis not present

## 2019-08-15 DIAGNOSIS — I10 Essential (primary) hypertension: Secondary | ICD-10-CM | POA: Diagnosis not present

## 2019-08-21 DIAGNOSIS — D3A00 Benign carcinoid tumor of unspecified site: Secondary | ICD-10-CM | POA: Insufficient documentation

## 2019-08-21 DIAGNOSIS — C258 Malignant neoplasm of overlapping sites of pancreas: Secondary | ICD-10-CM | POA: Diagnosis not present

## 2019-08-23 DIAGNOSIS — C258 Malignant neoplasm of overlapping sites of pancreas: Secondary | ICD-10-CM | POA: Diagnosis not present

## 2019-08-23 DIAGNOSIS — D3A00 Benign carcinoid tumor of unspecified site: Secondary | ICD-10-CM | POA: Diagnosis not present

## 2019-08-24 DIAGNOSIS — E079 Disorder of thyroid, unspecified: Secondary | ICD-10-CM | POA: Diagnosis not present

## 2019-08-24 DIAGNOSIS — R251 Tremor, unspecified: Secondary | ICD-10-CM | POA: Diagnosis not present

## 2019-08-28 ENCOUNTER — Other Ambulatory Visit (HOSPITAL_COMMUNITY): Payer: Self-pay | Admitting: Oncology

## 2019-08-29 ENCOUNTER — Other Ambulatory Visit (HOSPITAL_COMMUNITY): Payer: Self-pay | Admitting: Oncology

## 2019-08-29 ENCOUNTER — Other Ambulatory Visit: Payer: Self-pay | Admitting: Oncology

## 2019-08-29 DIAGNOSIS — C258 Malignant neoplasm of overlapping sites of pancreas: Secondary | ICD-10-CM

## 2019-08-29 DIAGNOSIS — D3A00 Benign carcinoid tumor of unspecified site: Secondary | ICD-10-CM

## 2019-09-06 ENCOUNTER — Other Ambulatory Visit (HOSPITAL_COMMUNITY): Payer: Self-pay | Admitting: Oncology

## 2019-09-06 DIAGNOSIS — D3A00 Benign carcinoid tumor of unspecified site: Secondary | ICD-10-CM

## 2019-09-11 ENCOUNTER — Ambulatory Visit (HOSPITAL_COMMUNITY): Payer: Medicare Other

## 2019-09-11 ENCOUNTER — Encounter (HOSPITAL_COMMUNITY): Payer: Self-pay

## 2019-09-11 DIAGNOSIS — I10 Essential (primary) hypertension: Secondary | ICD-10-CM | POA: Diagnosis not present

## 2019-09-11 DIAGNOSIS — E1165 Type 2 diabetes mellitus with hyperglycemia: Secondary | ICD-10-CM | POA: Diagnosis not present

## 2019-09-11 DIAGNOSIS — K210 Gastro-esophageal reflux disease with esophagitis: Secondary | ICD-10-CM | POA: Diagnosis not present

## 2019-09-14 ENCOUNTER — Encounter (HOSPITAL_COMMUNITY)
Admission: RE | Admit: 2019-09-14 | Discharge: 2019-09-14 | Disposition: A | Discharge status: Home / Self Care | Payer: Medicare Other | Source: Ambulatory Visit | Attending: Oncology | Admitting: Oncology

## 2019-09-14 ENCOUNTER — Other Ambulatory Visit: Payer: Self-pay

## 2019-09-14 DIAGNOSIS — D3A8 Other benign neuroendocrine tumors: Secondary | ICD-10-CM | POA: Diagnosis not present

## 2019-09-14 DIAGNOSIS — D3A00 Benign carcinoid tumor of unspecified site: Secondary | ICD-10-CM | POA: Insufficient documentation

## 2019-09-14 MED ORDER — GALLIUM GA 68 DOTATATE IV KIT: 3.1000 | PACK | Freq: Once | INTRAVENOUS | Status: DC | PRN

## 2019-09-19 DIAGNOSIS — C7A00 Malignant carcinoid tumor of unspecified site: Secondary | ICD-10-CM | POA: Diagnosis not present

## 2019-09-20 DIAGNOSIS — I10 Essential (primary) hypertension: Secondary | ICD-10-CM | POA: Diagnosis not present

## 2019-09-20 DIAGNOSIS — I6523 Occlusion and stenosis of bilateral carotid arteries: Secondary | ICD-10-CM | POA: Diagnosis not present

## 2019-09-20 DIAGNOSIS — Z951 Presence of aortocoronary bypass graft: Secondary | ICD-10-CM | POA: Diagnosis not present

## 2019-09-20 DIAGNOSIS — I2510 Atherosclerotic heart disease of native coronary artery without angina pectoris: Secondary | ICD-10-CM | POA: Diagnosis not present

## 2019-10-05 DIAGNOSIS — C7A00 Malignant carcinoid tumor of unspecified site: Secondary | ICD-10-CM | POA: Diagnosis not present

## 2019-10-05 DIAGNOSIS — N2889 Other specified disorders of kidney and ureter: Secondary | ICD-10-CM | POA: Diagnosis not present

## 2019-10-09 DIAGNOSIS — E1165 Type 2 diabetes mellitus with hyperglycemia: Secondary | ICD-10-CM | POA: Diagnosis not present

## 2019-10-09 DIAGNOSIS — K219 Gastro-esophageal reflux disease without esophagitis: Secondary | ICD-10-CM | POA: Diagnosis not present

## 2019-10-09 DIAGNOSIS — I10 Essential (primary) hypertension: Secondary | ICD-10-CM | POA: Diagnosis not present

## 2019-10-15 DIAGNOSIS — I6529 Occlusion and stenosis of unspecified carotid artery: Secondary | ICD-10-CM | POA: Diagnosis not present

## 2019-10-15 DIAGNOSIS — I2510 Atherosclerotic heart disease of native coronary artery without angina pectoris: Secondary | ICD-10-CM | POA: Diagnosis not present

## 2019-10-15 DIAGNOSIS — Z9049 Acquired absence of other specified parts of digestive tract: Secondary | ICD-10-CM | POA: Diagnosis not present

## 2019-10-15 DIAGNOSIS — E1136 Type 2 diabetes mellitus with diabetic cataract: Secondary | ICD-10-CM | POA: Diagnosis not present

## 2019-10-15 DIAGNOSIS — E1122 Type 2 diabetes mellitus with diabetic chronic kidney disease: Secondary | ICD-10-CM | POA: Diagnosis not present

## 2019-10-15 DIAGNOSIS — R011 Cardiac murmur, unspecified: Secondary | ICD-10-CM | POA: Diagnosis not present

## 2019-10-15 DIAGNOSIS — D3A00 Benign carcinoid tumor of unspecified site: Secondary | ICD-10-CM | POA: Diagnosis not present

## 2019-10-15 DIAGNOSIS — I509 Heart failure, unspecified: Secondary | ICD-10-CM | POA: Diagnosis not present

## 2019-10-15 DIAGNOSIS — G20 Parkinson's disease: Secondary | ICD-10-CM | POA: Diagnosis not present

## 2019-10-15 DIAGNOSIS — E042 Nontoxic multinodular goiter: Secondary | ICD-10-CM | POA: Diagnosis not present

## 2019-10-15 DIAGNOSIS — Z7982 Long term (current) use of aspirin: Secondary | ICD-10-CM | POA: Diagnosis not present

## 2019-10-15 DIAGNOSIS — N189 Chronic kidney disease, unspecified: Secondary | ICD-10-CM | POA: Diagnosis not present

## 2019-10-15 DIAGNOSIS — I130 Hypertensive heart and chronic kidney disease with heart failure and stage 1 through stage 4 chronic kidney disease, or unspecified chronic kidney disease: Secondary | ICD-10-CM | POA: Diagnosis not present

## 2019-10-15 DIAGNOSIS — M47816 Spondylosis without myelopathy or radiculopathy, lumbar region: Secondary | ICD-10-CM | POA: Diagnosis not present

## 2019-10-15 DIAGNOSIS — Z23 Encounter for immunization: Secondary | ICD-10-CM | POA: Diagnosis not present

## 2019-10-15 DIAGNOSIS — R6881 Early satiety: Secondary | ICD-10-CM | POA: Diagnosis not present

## 2019-10-15 DIAGNOSIS — Z79899 Other long term (current) drug therapy: Secondary | ICD-10-CM | POA: Diagnosis not present

## 2019-10-15 DIAGNOSIS — E785 Hyperlipidemia, unspecified: Secondary | ICD-10-CM | POA: Diagnosis not present

## 2019-10-15 DIAGNOSIS — Z823 Family history of stroke: Secondary | ICD-10-CM | POA: Diagnosis not present

## 2019-10-15 DIAGNOSIS — M48061 Spinal stenosis, lumbar region without neurogenic claudication: Secondary | ICD-10-CM | POA: Diagnosis not present

## 2019-10-15 DIAGNOSIS — Z794 Long term (current) use of insulin: Secondary | ICD-10-CM | POA: Diagnosis not present

## 2019-10-22 DIAGNOSIS — E785 Hyperlipidemia, unspecified: Secondary | ICD-10-CM | POA: Diagnosis not present

## 2019-10-22 DIAGNOSIS — Z0000 Encounter for general adult medical examination without abnormal findings: Secondary | ICD-10-CM | POA: Diagnosis not present

## 2019-10-22 DIAGNOSIS — Z6824 Body mass index (BMI) 24.0-24.9, adult: Secondary | ICD-10-CM | POA: Diagnosis not present

## 2019-10-22 DIAGNOSIS — I10 Essential (primary) hypertension: Secondary | ICD-10-CM | POA: Diagnosis not present

## 2019-10-22 DIAGNOSIS — E119 Type 2 diabetes mellitus without complications: Secondary | ICD-10-CM | POA: Diagnosis not present

## 2019-10-23 DIAGNOSIS — G20 Parkinson's disease: Secondary | ICD-10-CM | POA: Diagnosis not present

## 2019-10-23 DIAGNOSIS — D3A00 Benign carcinoid tumor of unspecified site: Secondary | ICD-10-CM | POA: Diagnosis not present

## 2019-11-06 DIAGNOSIS — G20 Parkinson's disease: Secondary | ICD-10-CM | POA: Diagnosis not present

## 2019-11-09 DIAGNOSIS — E119 Type 2 diabetes mellitus without complications: Secondary | ICD-10-CM | POA: Diagnosis not present

## 2019-11-09 DIAGNOSIS — E7849 Other hyperlipidemia: Secondary | ICD-10-CM | POA: Diagnosis not present

## 2019-11-09 DIAGNOSIS — I10 Essential (primary) hypertension: Secondary | ICD-10-CM | POA: Diagnosis not present

## 2019-11-15 DIAGNOSIS — G20 Parkinson's disease: Secondary | ICD-10-CM | POA: Diagnosis not present

## 2019-11-15 DIAGNOSIS — G250 Essential tremor: Secondary | ICD-10-CM | POA: Diagnosis not present

## 2019-11-29 DIAGNOSIS — I10 Essential (primary) hypertension: Secondary | ICD-10-CM | POA: Diagnosis not present

## 2019-11-29 DIAGNOSIS — K2100 Gastro-esophageal reflux disease with esophagitis, without bleeding: Secondary | ICD-10-CM | POA: Diagnosis not present

## 2019-11-29 DIAGNOSIS — E1165 Type 2 diabetes mellitus with hyperglycemia: Secondary | ICD-10-CM | POA: Diagnosis not present

## 2019-12-11 DIAGNOSIS — M5431 Sciatica, right side: Secondary | ICD-10-CM | POA: Diagnosis not present

## 2019-12-11 DIAGNOSIS — Z6824 Body mass index (BMI) 24.0-24.9, adult: Secondary | ICD-10-CM | POA: Diagnosis not present

## 2019-12-20 DIAGNOSIS — R008 Other abnormalities of heart beat: Secondary | ICD-10-CM | POA: Diagnosis not present

## 2019-12-26 DIAGNOSIS — Z951 Presence of aortocoronary bypass graft: Secondary | ICD-10-CM | POA: Diagnosis not present

## 2019-12-26 DIAGNOSIS — E11649 Type 2 diabetes mellitus with hypoglycemia without coma: Secondary | ICD-10-CM | POA: Diagnosis not present

## 2019-12-26 DIAGNOSIS — Z8249 Family history of ischemic heart disease and other diseases of the circulatory system: Secondary | ICD-10-CM | POA: Diagnosis not present

## 2019-12-26 DIAGNOSIS — Z79899 Other long term (current) drug therapy: Secondary | ICD-10-CM | POA: Diagnosis not present

## 2019-12-26 DIAGNOSIS — Z794 Long term (current) use of insulin: Secondary | ICD-10-CM | POA: Diagnosis not present

## 2019-12-26 DIAGNOSIS — Z5329 Procedure and treatment not carried out because of patient's decision for other reasons: Secondary | ICD-10-CM | POA: Diagnosis not present

## 2019-12-26 DIAGNOSIS — Z833 Family history of diabetes mellitus: Secondary | ICD-10-CM | POA: Diagnosis not present

## 2019-12-26 DIAGNOSIS — Z7982 Long term (current) use of aspirin: Secondary | ICD-10-CM | POA: Diagnosis not present

## 2019-12-26 DIAGNOSIS — I10 Essential (primary) hypertension: Secondary | ICD-10-CM | POA: Diagnosis not present

## 2019-12-26 DIAGNOSIS — K219 Gastro-esophageal reflux disease without esophagitis: Secondary | ICD-10-CM | POA: Diagnosis not present

## 2019-12-31 DIAGNOSIS — E162 Hypoglycemia, unspecified: Secondary | ICD-10-CM | POA: Diagnosis not present

## 2019-12-31 DIAGNOSIS — Z6823 Body mass index (BMI) 23.0-23.9, adult: Secondary | ICD-10-CM | POA: Diagnosis not present

## 2020-01-03 DIAGNOSIS — I10 Essential (primary) hypertension: Secondary | ICD-10-CM | POA: Diagnosis not present

## 2020-01-03 DIAGNOSIS — K2100 Gastro-esophageal reflux disease with esophagitis, without bleeding: Secondary | ICD-10-CM | POA: Diagnosis not present

## 2020-01-03 DIAGNOSIS — E1165 Type 2 diabetes mellitus with hyperglycemia: Secondary | ICD-10-CM | POA: Diagnosis not present

## 2020-01-21 DIAGNOSIS — E785 Hyperlipidemia, unspecified: Secondary | ICD-10-CM | POA: Diagnosis not present

## 2020-01-21 DIAGNOSIS — I10 Essential (primary) hypertension: Secondary | ICD-10-CM | POA: Diagnosis not present

## 2020-01-21 DIAGNOSIS — E119 Type 2 diabetes mellitus without complications: Secondary | ICD-10-CM | POA: Diagnosis not present

## 2020-01-21 DIAGNOSIS — Z6823 Body mass index (BMI) 23.0-23.9, adult: Secondary | ICD-10-CM | POA: Diagnosis not present

## 2020-01-21 DIAGNOSIS — E162 Hypoglycemia, unspecified: Secondary | ICD-10-CM | POA: Diagnosis not present

## 2020-01-28 DIAGNOSIS — I6522 Occlusion and stenosis of left carotid artery: Secondary | ICD-10-CM | POA: Diagnosis not present

## 2020-01-31 DIAGNOSIS — M5431 Sciatica, right side: Secondary | ICD-10-CM | POA: Diagnosis not present

## 2020-01-31 DIAGNOSIS — Z6823 Body mass index (BMI) 23.0-23.9, adult: Secondary | ICD-10-CM | POA: Diagnosis not present

## 2020-02-09 ENCOUNTER — Other Ambulatory Visit: Payer: Self-pay

## 2020-02-09 ENCOUNTER — Ambulatory Visit: Payer: Medicare Other | Attending: Internal Medicine

## 2020-02-09 DIAGNOSIS — Z23 Encounter for immunization: Secondary | ICD-10-CM

## 2020-02-09 NOTE — Progress Notes (Signed)
   Covid-19 Vaccination Clinic  Name:  Alexa Mcpherson    MRN: [id number] DOB: [birth year]  02/09/2020  [name] was observed post Covid-19 immunization for 15 minutes without incidence. She was provided with Vaccine Information Sheet and instruction to access the V-Safe system.   [name] was instructed to call 911 with any severe reactions post vaccine: Marland Kitchen Difficulty breathing  . Swelling of your face and throat  . A fast heartbeat  . A bad rash all over your body  . Dizziness and weakness    Immunizations Administered    Name Date Dose VIS Date Route   Moderna COVID-19 Vaccine 02/09/2020  1:24 PM 0.5 mL 11/27/2019 Intramuscular   Manufacturer: Moderna   Lot: [id number]   [id number]

## 2020-02-13 IMAGING — PT NM PET NOPR SKULL BASE TO THIGH
7 of 8 series · 23 of 25 positions shown · non-contrast
Comparison: CT 06/07/2019

CLINICAL DATA: Neuroendocrine tumor.

EXAM:
NUCLEAR MEDICINE PET SKULL BASE TO THIGH
TECHNIQUE: 3.1 mCi Ga 68 DOTATATE was injected intravenously. Full-ring PET
imaging was performed from the skull base to thigh after the
radiotracer. CT data was obtained and used for attenuation
correction and anatomic localization.

[Series 3: pet sk_thigh ac · axial · 5.0mm · 4.07mm/px · z∈[-1502,-658]mm · 5 of 212 slices shown]
[im 1/212]
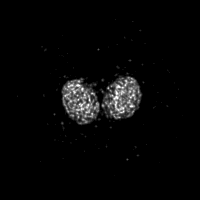
[im 53/212]
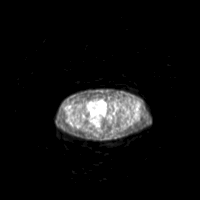
[im 106/212]
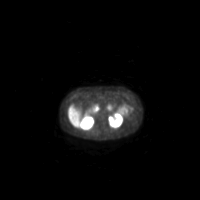
[im 159/212]
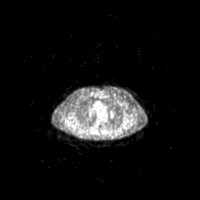
[im 212/212]
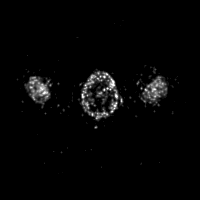

[Series 4: ct sk_thigh 5.0 b31f · axial · 5.0mm · 0.98mm/px · z∈[-1502,-658]mm · 4 of 212 slices shown]
[im 1/212]
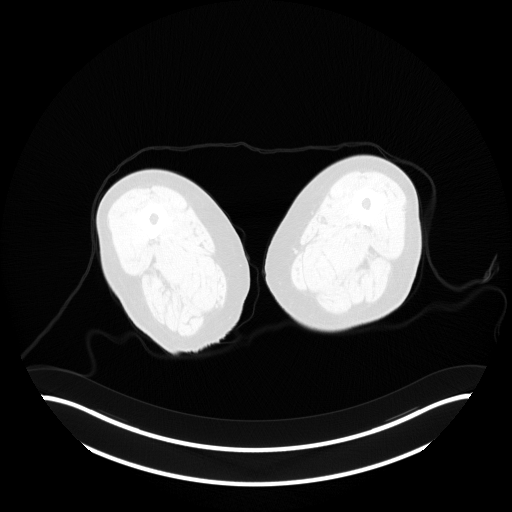
[im 106/212]
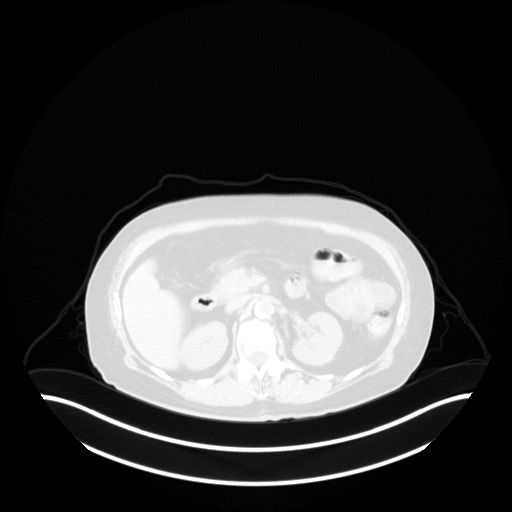
[im 159/212]
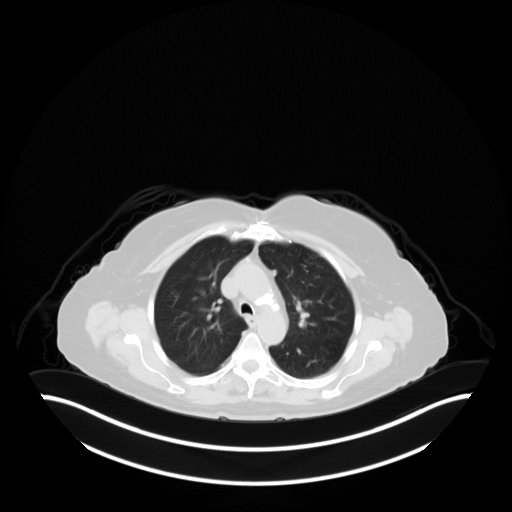
[im 212/212  brain]
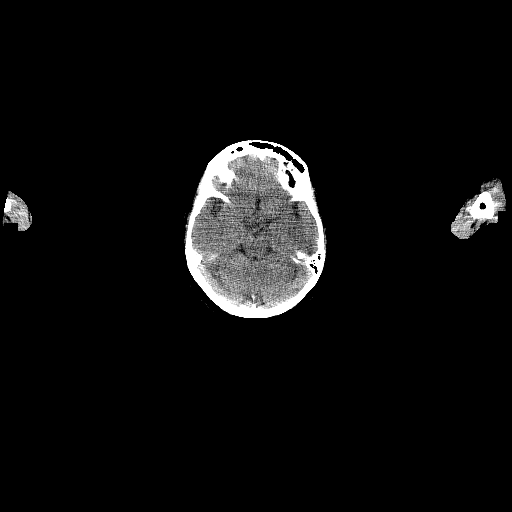

[Series 5: pet sk_thigh nac · axial · 5.0mm · 4.07mm/px · z∈[-1502,-658]mm · 5 of 212 slices shown]
[im 1/212]
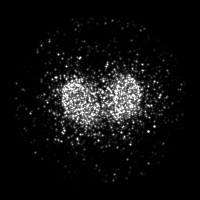
[im 53/212]
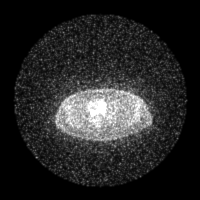
[im 106/212]
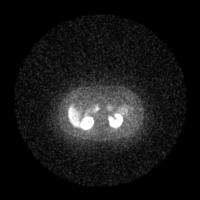
[im 159/212]
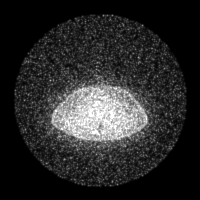
[im 212/212]
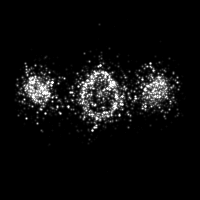

[Series 8: ct sk_thigh 5.0 (id) lung_bone · axial · 5.0mm · 0.72mm/px · 1 of 59 slices shown]
[im 1/59  bone]
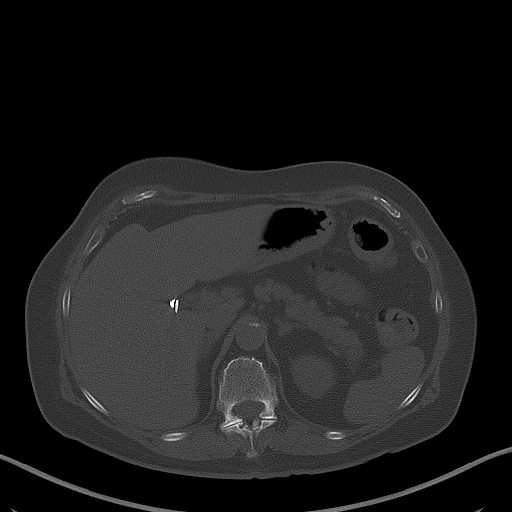

[Series 603: range-ct sk_thigh 5.0 (id)<alpha range> · 2 of 82 slices shown (1 of 2)]
[im 1/82]
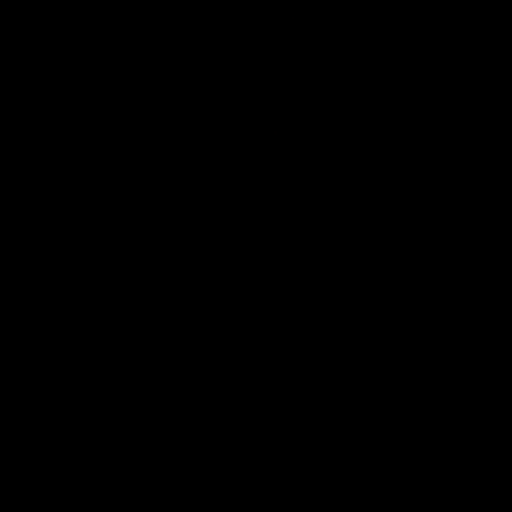
[im 82/82]
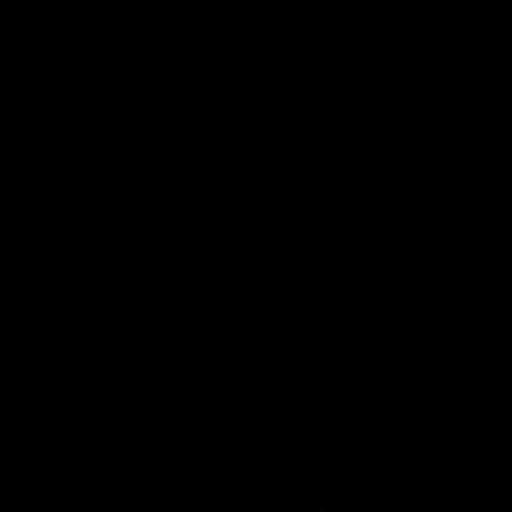

[Series 605: range-ct sk_thigh 5.0 (id)<alpha range> · 5 of 206 slices shown (2 of 2)]
[im 1/206]
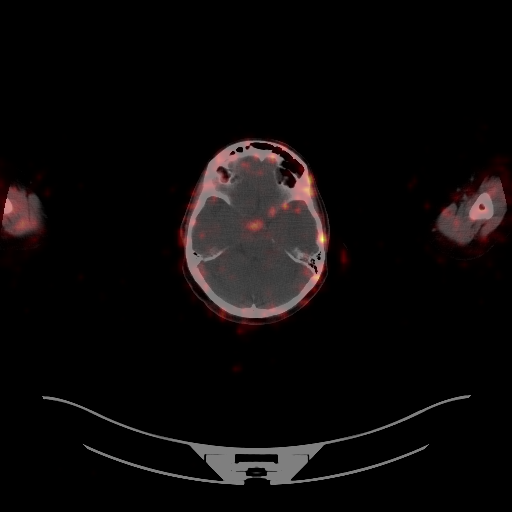
[im 52/206]
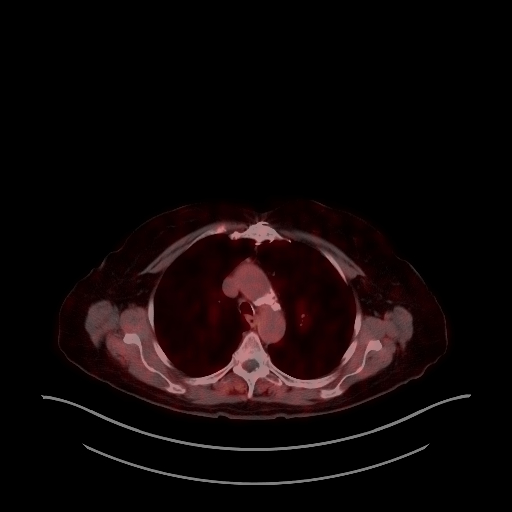
[im 103/206]
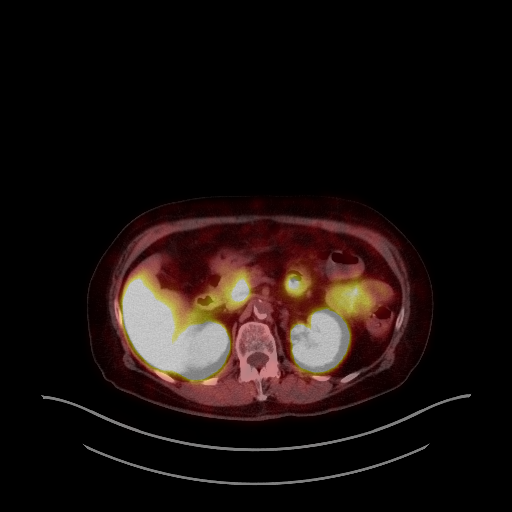
[im 154/206]
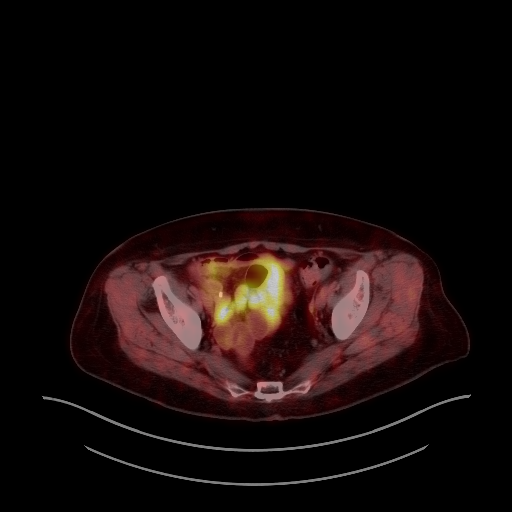
[im 206/206]
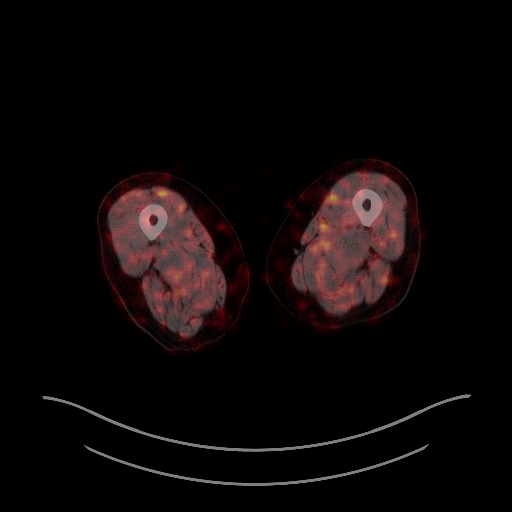

[Series 8057: results mm oncology reading · 1.0mm · 0.50mm/px · 1 of 2 slices shown]
[im 1/2]
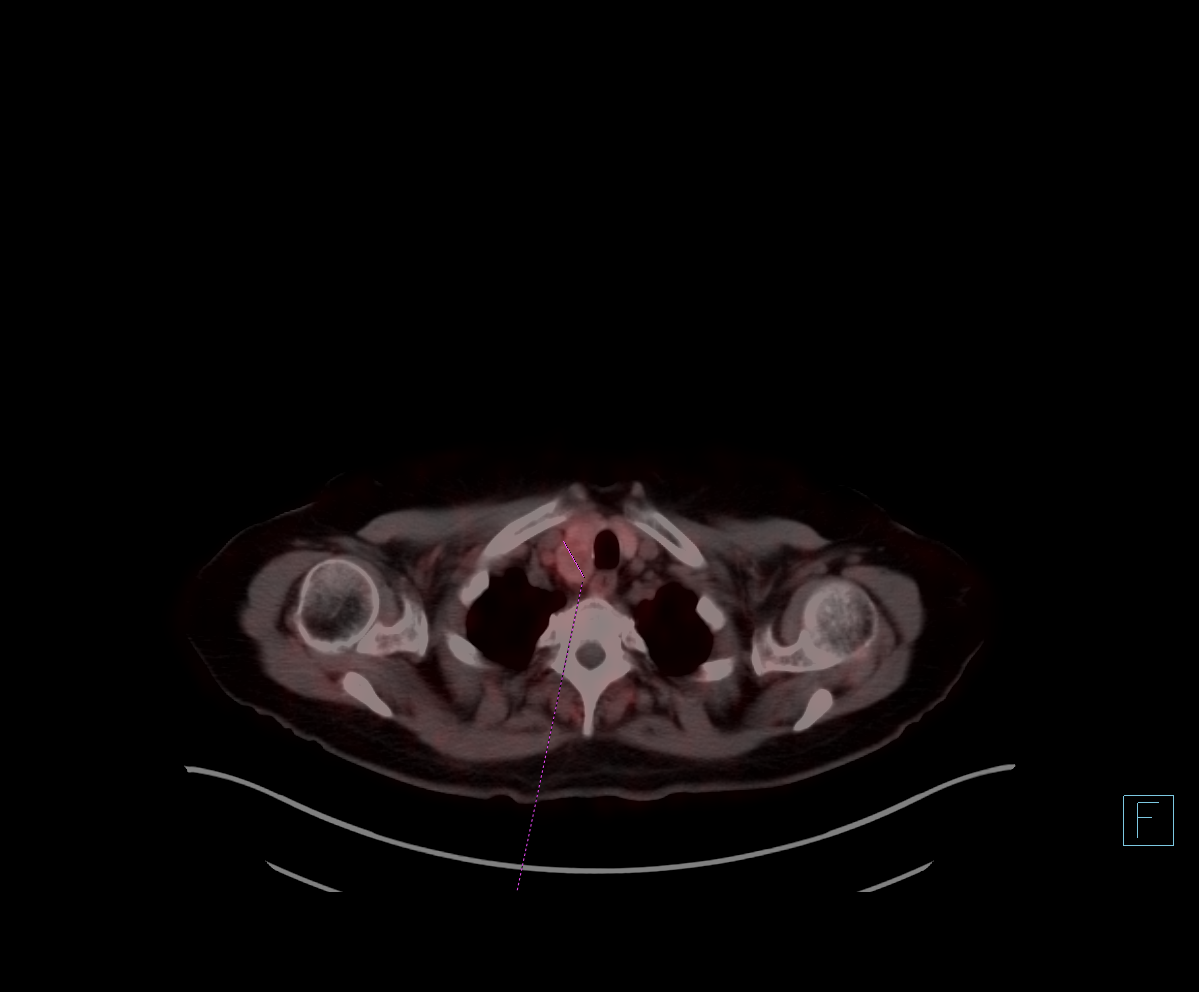

[23 of 25 positions shown; findings below may reference images not displayed]

FINDINGS: NECK

No radiotracer activity in neck lymph nodes.

Incidental CT findings: 2 cm heterogeneous nodule arises from the
posterior aspect of right lobe of thyroid.

CHEST

No radiotracer accumulation within mediastinal or hilar lymph nodes.
No suspicious pulmonary nodules on the CT scan.

Incidental CT finding:2 mm perifissural nodule in the left lower
lobe is too small to characterize, image [DATE]. Anterior right lung
base nodule measures 3 mm, image 41/8. Too small to characterize.
Previous median sternotomy and CABG procedure. Aortic
atherosclerosis. No pericardial effusion.

ABDOMEN/PELVIS

Medium size focus of increased radiotracer uptake localizes to the
uncinate process of the pancreas within SUV max of 10.31. No
corresponding anatomic abnormality noted on the CT images, image
110/4.

Physiologic activity noted in the liver, spleen, adrenal glands and
kidneys.

Incidental CT findings:None

SKELETON

No focal activity to suggest skeletal metastasis.

Incidental CT findings:None
IMPRESSION: 1. There is a focal area of increased radiotracer uptake localizing
to the uncinate process of the pancreas. No corresponding anatomic
abnormality noted on the nondiagnostic unenhanced CT. Cannot rule
out DOTATATE avid neuroendocrine tumor of the pancreas. Recommend
further evaluation with contrast enhanced pancreas protocol MRI for
more detailed anatomic evaluation of this area.
2. Two small perifissural nodules are identified within the lungs,
too small to characterize by PET.
3. Right lobe of thyroid gland nodule measures 2 cm. Consider
further evaluation with thyroid sonogram.

## 2020-03-08 ENCOUNTER — Ambulatory Visit: Payer: Medicare Other | Attending: Internal Medicine

## 2020-03-08 DIAGNOSIS — Z23 Encounter for immunization: Secondary | ICD-10-CM

## 2020-03-08 NOTE — Progress Notes (Signed)
   Covid-19 Vaccination Clinic  Name:  Alexa Mcpherson    MRN: [id number] DOB: January 20, 1941  03/08/2020  [name] was observed post Covid-19 immunization for 15 minutes without incident. She was provided with Vaccine Information Sheet and instruction to access the V-Safe system.   [name] was instructed to call 911 with any severe reactions post vaccine: Marland Kitchen Difficulty breathing  . Swelling of face and throat  . A fast heartbeat  . A bad rash all over body  . Dizziness and weakness   Immunizations Administered    Name Date Dose VIS Date Route   Moderna COVID-19 Vaccine 03/08/2020 11:52 AM 0.5 mL 11/27/2019 Intramuscular   Manufacturer: Moderna   Lot: [id number]   [id number]

## 2020-04-01 DIAGNOSIS — E1165 Type 2 diabetes mellitus with hyperglycemia: Secondary | ICD-10-CM | POA: Diagnosis not present

## 2020-04-01 DIAGNOSIS — I10 Essential (primary) hypertension: Secondary | ICD-10-CM | POA: Diagnosis not present

## 2020-04-01 DIAGNOSIS — K2100 Gastro-esophageal reflux disease with esophagitis, without bleeding: Secondary | ICD-10-CM | POA: Diagnosis not present

## 2020-04-03 DIAGNOSIS — Z6822 Body mass index (BMI) 22.0-22.9, adult: Secondary | ICD-10-CM | POA: Diagnosis not present

## 2020-04-03 DIAGNOSIS — M5431 Sciatica, right side: Secondary | ICD-10-CM | POA: Diagnosis not present

## 2020-04-04 ENCOUNTER — Other Ambulatory Visit: Payer: Self-pay | Admitting: Internal Medicine

## 2020-04-04 DIAGNOSIS — G8929 Other chronic pain: Secondary | ICD-10-CM

## 2020-04-04 DIAGNOSIS — M545 Low back pain, unspecified: Secondary | ICD-10-CM

## 2020-04-10 ENCOUNTER — Other Ambulatory Visit: Payer: Self-pay

## 2020-04-10 ENCOUNTER — Other Ambulatory Visit: Payer: Self-pay | Admitting: Internal Medicine

## 2020-04-10 ENCOUNTER — Ambulatory Visit
Admission: RE | Admit: 2020-04-10 | Discharge: 2020-04-10 | Disposition: A | Discharge status: Home / Self Care | Payer: Medicare Other | Source: Ambulatory Visit | Attending: Internal Medicine | Admitting: Internal Medicine

## 2020-04-10 DIAGNOSIS — G8929 Other chronic pain: Secondary | ICD-10-CM

## 2020-04-10 DIAGNOSIS — M545 Low back pain, unspecified: Secondary | ICD-10-CM

## 2020-04-10 DIAGNOSIS — M5417 Radiculopathy, lumbosacral region: Secondary | ICD-10-CM | POA: Diagnosis not present

## 2020-04-10 MED ORDER — METHYLPREDNISOLONE ACETATE 40 MG/ML INJ SUSP (RADIOLOG
120.0000 mg | Freq: Once | INTRAMUSCULAR | Status: AC
  Administered 2020-04-10: 12:00:00 120 mg via EPIDURAL

## 2020-04-10 MED ORDER — IOPAMIDOL (ISOVUE-M 200) INJECTION 41%
1.0000 mL | Freq: Once | INTRAMUSCULAR | Status: AC
  Administered 2020-04-10: 1 mL via EPIDURAL

## 2020-04-10 NOTE — Discharge Instructions (Signed)

## 2020-04-21 DIAGNOSIS — N3000 Acute cystitis without hematuria: Secondary | ICD-10-CM | POA: Diagnosis not present

## 2020-04-21 DIAGNOSIS — M5431 Sciatica, right side: Secondary | ICD-10-CM | POA: Diagnosis not present

## 2020-04-21 DIAGNOSIS — Z6822 Body mass index (BMI) 22.0-22.9, adult: Secondary | ICD-10-CM | POA: Diagnosis not present

## 2020-04-21 DIAGNOSIS — E785 Hyperlipidemia, unspecified: Secondary | ICD-10-CM | POA: Diagnosis not present

## 2020-04-21 DIAGNOSIS — E119 Type 2 diabetes mellitus without complications: Secondary | ICD-10-CM | POA: Diagnosis not present

## 2020-05-02 DIAGNOSIS — E785 Hyperlipidemia, unspecified: Secondary | ICD-10-CM | POA: Diagnosis not present

## 2020-05-02 DIAGNOSIS — E119 Type 2 diabetes mellitus without complications: Secondary | ICD-10-CM | POA: Diagnosis not present

## 2020-05-21 DIAGNOSIS — G250 Essential tremor: Secondary | ICD-10-CM | POA: Diagnosis not present

## 2020-05-21 DIAGNOSIS — G20 Parkinson's disease: Secondary | ICD-10-CM | POA: Diagnosis not present

## 2020-05-21 DIAGNOSIS — Z79899 Other long term (current) drug therapy: Secondary | ICD-10-CM | POA: Diagnosis not present

## 2020-06-11 DIAGNOSIS — E119 Type 2 diabetes mellitus without complications: Secondary | ICD-10-CM | POA: Diagnosis not present

## 2020-06-11 DIAGNOSIS — E785 Hyperlipidemia, unspecified: Secondary | ICD-10-CM | POA: Diagnosis not present

## 2020-07-08 DIAGNOSIS — E785 Hyperlipidemia, unspecified: Secondary | ICD-10-CM | POA: Diagnosis not present

## 2020-07-08 DIAGNOSIS — E119 Type 2 diabetes mellitus without complications: Secondary | ICD-10-CM | POA: Diagnosis not present

## 2020-07-21 DIAGNOSIS — Z6824 Body mass index (BMI) 24.0-24.9, adult: Secondary | ICD-10-CM | POA: Diagnosis not present

## 2020-07-21 DIAGNOSIS — E119 Type 2 diabetes mellitus without complications: Secondary | ICD-10-CM | POA: Diagnosis not present

## 2020-07-21 DIAGNOSIS — E785 Hyperlipidemia, unspecified: Secondary | ICD-10-CM | POA: Diagnosis not present

## 2020-07-21 DIAGNOSIS — M5431 Sciatica, right side: Secondary | ICD-10-CM | POA: Diagnosis not present

## 2020-07-21 DIAGNOSIS — Z0000 Encounter for general adult medical examination without abnormal findings: Secondary | ICD-10-CM | POA: Diagnosis not present

## 2020-07-28 DIAGNOSIS — I6522 Occlusion and stenosis of left carotid artery: Secondary | ICD-10-CM | POA: Diagnosis not present

## 2020-07-28 DIAGNOSIS — I6523 Occlusion and stenosis of bilateral carotid arteries: Secondary | ICD-10-CM | POA: Diagnosis not present

## 2020-08-04 DIAGNOSIS — E118 Type 2 diabetes mellitus with unspecified complications: Secondary | ICD-10-CM | POA: Diagnosis not present

## 2020-08-04 DIAGNOSIS — K219 Gastro-esophageal reflux disease without esophagitis: Secondary | ICD-10-CM | POA: Diagnosis not present

## 2020-08-04 DIAGNOSIS — Z79899 Other long term (current) drug therapy: Secondary | ICD-10-CM | POA: Diagnosis not present

## 2020-08-04 DIAGNOSIS — R011 Cardiac murmur, unspecified: Secondary | ICD-10-CM | POA: Diagnosis not present

## 2020-08-04 DIAGNOSIS — I6522 Occlusion and stenosis of left carotid artery: Secondary | ICD-10-CM | POA: Diagnosis not present

## 2020-08-04 DIAGNOSIS — E785 Hyperlipidemia, unspecified: Secondary | ICD-10-CM | POA: Diagnosis not present

## 2020-08-04 DIAGNOSIS — Z7982 Long term (current) use of aspirin: Secondary | ICD-10-CM | POA: Diagnosis not present

## 2020-08-04 DIAGNOSIS — Z7901 Long term (current) use of anticoagulants: Secondary | ICD-10-CM | POA: Diagnosis not present

## 2020-08-04 DIAGNOSIS — I10 Essential (primary) hypertension: Secondary | ICD-10-CM | POA: Diagnosis not present

## 2020-08-06 DIAGNOSIS — E785 Hyperlipidemia, unspecified: Secondary | ICD-10-CM | POA: Diagnosis not present

## 2020-08-06 DIAGNOSIS — E119 Type 2 diabetes mellitus without complications: Secondary | ICD-10-CM | POA: Diagnosis not present

## 2020-09-15 DIAGNOSIS — E785 Hyperlipidemia, unspecified: Secondary | ICD-10-CM | POA: Diagnosis not present

## 2020-09-15 DIAGNOSIS — E119 Type 2 diabetes mellitus without complications: Secondary | ICD-10-CM | POA: Diagnosis not present

## 2020-09-17 DIAGNOSIS — Z79899 Other long term (current) drug therapy: Secondary | ICD-10-CM | POA: Diagnosis not present

## 2020-09-17 DIAGNOSIS — Z95828 Presence of other vascular implants and grafts: Secondary | ICD-10-CM | POA: Diagnosis not present

## 2020-09-17 DIAGNOSIS — Z8679 Personal history of other diseases of the circulatory system: Secondary | ICD-10-CM | POA: Diagnosis not present

## 2020-09-17 DIAGNOSIS — I6522 Occlusion and stenosis of left carotid artery: Secondary | ICD-10-CM | POA: Diagnosis not present

## 2020-09-17 DIAGNOSIS — G250 Essential tremor: Secondary | ICD-10-CM | POA: Diagnosis not present

## 2020-09-19 DIAGNOSIS — E782 Mixed hyperlipidemia: Secondary | ICD-10-CM | POA: Diagnosis not present

## 2020-09-19 DIAGNOSIS — Z951 Presence of aortocoronary bypass graft: Secondary | ICD-10-CM | POA: Diagnosis not present

## 2020-09-19 DIAGNOSIS — I2510 Atherosclerotic heart disease of native coronary artery without angina pectoris: Secondary | ICD-10-CM | POA: Diagnosis not present

## 2020-09-19 DIAGNOSIS — I6523 Occlusion and stenosis of bilateral carotid arteries: Secondary | ICD-10-CM | POA: Diagnosis not present

## 2020-09-19 DIAGNOSIS — I10 Essential (primary) hypertension: Secondary | ICD-10-CM | POA: Diagnosis not present

## 2020-09-25 ENCOUNTER — Ambulatory Visit: Payer: Self-pay

## 2020-09-25 ENCOUNTER — Ambulatory Visit (INDEPENDENT_AMBULATORY_CARE_PROVIDER_SITE_OTHER): Payer: Medicare Other | Admitting: Orthopaedic Surgery

## 2020-09-25 ENCOUNTER — Other Ambulatory Visit: Payer: Self-pay

## 2020-09-25 DIAGNOSIS — G8929 Other chronic pain: Secondary | ICD-10-CM | POA: Diagnosis not present

## 2020-09-25 DIAGNOSIS — M5441 Lumbago with sciatica, right side: Secondary | ICD-10-CM | POA: Diagnosis not present

## 2020-09-25 MED ORDER — HYDROCODONE-ACETAMINOPHEN 5-325 MG PO TABS: 1.0000 | ORAL_TABLET | Freq: Four times a day (QID) | ORAL | 0 refills | Status: DC | PRN

## 2020-09-25 NOTE — Addendum Note (Signed)
Addended byLaurann [place] on: 09/25/2020 03:31 PM   Modules accepted: Orders

## 2020-09-25 NOTE — Progress Notes (Signed)
Office Visit Note   Patient: Alexa Mcpherson           Date of Birth: September 06, 1941           MRN: [id number] Visit Date: 09/25/2020              Requested by: Neale Burly, MD [place],  [place] [id number] PCP: Neale Burly, MD   Assessment & Plan: Visit Diagnoses:  1. Chronic midline low back pain with right-sided sciatica     Plan: Norco prescribed for pain which she rates as severe and preventing her from sleeping.  She needs an MRI scan to evaluate her for L5 nerve root compression on the right where she has anterolisthesis at L4-5.  Office follow-up after scan for review.  With her type 2 diabetes will avoid the prednisone orally with the potential for associated hypoglycemia.  Follow-up after MRI scan for review.  Follow-Up Instructions: No follow-ups on file.   Orders:  Orders Placed This Encounter  Procedures  . XR Lumbar Spine 2-3 Views   No orders of the defined types were placed in this encounter.     Procedures: No procedures performed   Clinical Data: No additional findings.   Subjective: Chief Complaint  Patient presents with  . Lower Back - Pain    HPI 79 year old female referred by Dr.Hasanaj for back and radicular right leg pain.  She states she has had this severe pain for several months.  She has tried Neurontin without relief no relief with Tylenol.  Initial epidural gave her some relief which was on the right at L5 second epidural was not effective.  She states her pain is severe she cannot sleep she is tried melatonin.  She does have some PAD has history of carotid stent some coronary disease, hypertension.  She is never been a smoker.  Patient denies associated bowel or bladder symptoms no fever or chills.  Patient has had trouble walking she states her leg gave way in [place] she had to grab something to avoid falling to the floor.  She has been amatory with a pronounced right leg limp.  Review of Systems positive for coronary  disease carotid stent, PAD, cervical fusion, history of Parkinson's.   Objective: Vital Signs: BP 130/76   Pulse 64   Physical Exam Constitutional:      Appearance: She is well-developed.  HENT:     Head: Normocephalic.     Right Ear: External ear normal.     Left Ear: External ear normal.  Eyes:     Pupils: Pupils are equal, round, and reactive to light.  Neck:     Thyroid: No thyromegaly.     Trachea: No tracheal deviation.  Cardiovascular:     Rate and Rhythm: Normal rate.  Pulmonary:     Effort: Pulmonary effort is normal.  Abdominal:     Palpations: Abdomen is soft.  Skin:    General: Skin is warm and dry.  Neurological:     Mental Status: She is alert and oriented to person, place, and time.  Psychiatric:        Behavior: Behavior normal.     Ortho Exam patient has a positive straight leg raising on the right 70 degrees positive popliteal compression test positive sciatic notch tenderness.  Antalgic gait.  She has right leg giving way with attempted toe walking and heel walking.  Negative logroll of the hips distal pulses are 2+ knees reach full extension  with minimal crepitus collateral ligaments are stable. Specialty Comments:  No specialty comments available.  Imaging: No results found.   PMFS History: Patient Active Problem List   Diagnosis Date Noted  . Carcinoid tumor 08/21/2019  . Essential tremor 08/01/2019  . Abdominal pain 08/01/2019  . Weight loss, non-intentional 08/01/2019  . Parkinsonism ([place]) 05/23/2018  . Fatigue 02/02/2018  . Essential hypertension 08/16/2016  . Mixed hyperlipidemia 08/16/2016  . Type 2 diabetes mellitus without complication, without long-term current use of insulin ([facility]) 08/16/2016  . Iron deficiency anemia 04/18/2016  . Drug-induced hyperkalemia 04/18/2016  . Cerebrovascular disease 04/16/2016  . S/P cervical spinal fusion 09/03/2015  . PVD (peripheral vascular disease) ([facility]) 05/05/2015  . Bilateral carotid artery  stenosis 12/27/2014  . Presence of aortocoronary bypass graft 09/30/2014  . Coronary artery disease involving native coronary artery of native heart without angina pectoris 09/25/2014  . GERD (gastroesophageal reflux disease) 09/24/2014  . Abnormal stress test 09/24/2014  . Left carotid bruit 09/24/2014   Past Medical History:  Diagnosis Date  . Carotid stenosis    left ICA 60% 04/2015 (1 yr f/u rec, Dr. Glenford Bayley)  . Chronic lower back pain    "frequency depends upon how active I am" (09/02/2015)  . Coronary artery disease   . Daily headache    "related to pinched nerve" (09/03/2015)  . Depression    takes Paxil daily  . Dizziness    r/t neck issues  . GERD (gastroesophageal reflux disease)    takes Omeprazole daily  . Heart murmur   . Hyperlipidemia    takes Simvastatin daily  . Hypertension   . Migraine    "stopped when I went thru the change"  . Neck pain   . Pneumonia 1940's X 1  . Shakes    takes Propranolol daily  . Tingling    weakness and numbness in left arm  . Type II diabetes mellitus (HCC)    takes Glipizide and Metformin;Fasting sugar 105-125    No family history on file.  Past Surgical History:  Procedure Laterality Date  . ABDOMINAL HYSTERECTOMY    . ANTERIOR CERVICAL DECOMP/DISCECTOMY FUSION  09/03/2015   C5-6  . ANTERIOR CERVICAL DECOMP/DISCECTOMY FUSION Bilateral 09/03/2015   Procedure: C5-6 Anterior Cervical Discectomy and Fusion, Allograft, Plate ;  Surgeon: Marybelle Killings, MD;  Location: [facility];  Service: Orthopedics;  Laterality: Bilateral;  . CARPAL TUNNEL RELEASE Right ?1980's  . CATARACT EXTRACTION, BILATERAL Bilateral 2013  . COLON SURGERY    . COLONOSCOPY    . CORONARY ARTERY BYPASS GRAFT  09/24/2014   CABG X 4  . CYSTOSCOPY WITH BIOPSY  12/14/2011   Procedure: CYSTOSCOPY WITH BIOPSY;  Surgeon: Marissa Nestle;  Location: [facility];  Service: Urology;  Laterality: N/A;  Cystoscopy with Bladder Biopsies, Bladder Fulguration  . CYSTOSCOPY WITH  HYDRODISTENSION AND BIOPSY N/A 08/30/2018   Procedure: CYSTOSCOPY/BIOPSY/HYDRODISTENSION AND FULGUURATION;  Surgeon: Lucas Mallow, MD;  Location[facility];  Service: Urology;  Laterality: N/A;  . CYSTOSCOPY WITH URETHRAL DILATATION  12/14/2011   Procedure: CYSTOSCOPY WITH URETHRAL DILATATION;  Surgeon: Marissa Nestle;  Location: [facility];  Service: Urology;;  . ESOPHAGOGASTRODUODENOSCOPY    . LAPAROSCOPIC CHOLECYSTECTOMY  1990's  . TUBAL LIGATION  ~ 1968   Social History   Occupational History  . Not on file  Tobacco Use  . Smoking status: Never Smoker  . Smokeless tobacco: Never Used  Vaping Use  . Vaping Use: Never used  Substance and  Sexual Activity  . Alcohol use: No  . Drug use: No  . Sexual activity: Not Currently    Birth control/protection: Post-menopausal

## 2020-10-08 DIAGNOSIS — M4316 Spondylolisthesis, lumbar region: Secondary | ICD-10-CM | POA: Diagnosis not present

## 2020-10-08 DIAGNOSIS — M7138 Other bursal cyst, other site: Secondary | ICD-10-CM | POA: Diagnosis not present

## 2020-10-08 DIAGNOSIS — M5126 Other intervertebral disc displacement, lumbar region: Secondary | ICD-10-CM | POA: Diagnosis not present

## 2020-10-08 DIAGNOSIS — M5136 Other intervertebral disc degeneration, lumbar region: Secondary | ICD-10-CM | POA: Diagnosis not present

## 2020-10-08 DIAGNOSIS — M9973 Connective tissue and disc stenosis of intervertebral foramina of lumbar region: Secondary | ICD-10-CM | POA: Diagnosis not present

## 2020-10-08 DIAGNOSIS — M545 Low back pain, unspecified: Secondary | ICD-10-CM | POA: Diagnosis not present

## 2020-10-09 ENCOUNTER — Ambulatory Visit: Payer: Self-pay

## 2020-10-09 ENCOUNTER — Ambulatory Visit (INDEPENDENT_AMBULATORY_CARE_PROVIDER_SITE_OTHER): Payer: Medicare Other | Admitting: Orthopaedic Surgery

## 2020-10-09 VITALS — BP 149/69 | HR 60 | Ht 64.0 in | Wt 160.0 lb

## 2020-10-09 DIAGNOSIS — M5441 Lumbago with sciatica, right side: Secondary | ICD-10-CM

## 2020-10-09 DIAGNOSIS — G8929 Other chronic pain: Secondary | ICD-10-CM | POA: Diagnosis not present

## 2020-10-09 MED ORDER — HYDROCODONE-ACETAMINOPHEN 5-325 MG PO TABS
1.0000 | ORAL_TABLET | Freq: Four times a day (QID) | ORAL | 0 refills | Status: AC | PRN
Start: 2020-10-09 — End: ?

## 2020-10-09 NOTE — Progress Notes (Signed)
Office Visit Note   Patient: Alexa Mcpherson           Date of Birth: September 09, 1941           MRN: [id number] Visit Date: 10/09/2020              Requested by: Neale Burly, MD [place],  [place] [id number] PCP: Neale Burly, MD   Assessment & Plan: Visit Diagnoses:  1. Chronic midline low back pain with right-sided sciatica     Plan: Patient has severe spinal stenosis without instability but intraspinal extradural facet cyst.  We discussed options with instrumented fusion versus simple decompression procedure with removal of the cyst.  She understands it was simple decompression and cyst removal she may develop further instability with time might require a fusion.  Questions were elicited and answered she understands and requests we proceed with decompression.  Plan would be overnight stay.  We discussed using a walker.  Potential for dural tear if the cyst is adherent to the dura.  Questions were elicited and answered.  Follow-Up Instructions: No follow-ups on file.   Orders:  Orders Placed This Encounter  Procedures  . XR Lumb Spine Flex&Ext Only   Meds ordered this encounter  Medications  . HYDROcodone-acetaminophen (NORCO/VICODIN) 5-325 MG tablet    Sig: Take 1 tablet by mouth every 6 (six) hours as needed for moderate pain.    Dispense:  40 tablet    Refill:  0      Procedures: No procedures performed   Clinical Data: No additional findings.   Subjective: Chief Complaint  Patient presents with  . Lower Back - Follow-up    MRI lumbar review    HPI 79 year old female seen for follow-up of back pain with more right than left leg pain.  Increased pain for several months no response with Tylenol, Neurontin, epidural.  X-rays taken today shows anterolisthesis stable on flexion-extension.  No bowel or bladder symptoms no fever or chills.  She has been leaning over a grocery cart when she goes into the store.  She has been limping with right leg pain.   MRI scan shows intraspinal epidural facet cyst on the right with nerve root compression consistent with her symptoms and severe spinal stenosis at L4-5.  Review of Systems positive coronary artery disease.  Previous cervical fusion history of Parkinson's.  Claudication symptoms x6 months.   Objective: Vital Signs: BP (!) 149/69   Pulse 60   Ht 5\' 4"  (1.626 m)   Wt 160 lb (72.6 kg)   BMI 27.46 kg/m   Physical Exam Constitutional:      Appearance: She is well-developed.  HENT:     Head: Normocephalic.     Right Ear: External ear normal.     Left Ear: External ear normal.  Eyes:     Pupils: Pupils are equal, round, and reactive to light.  Neck:     Thyroid: No thyromegaly.     Trachea: No tracheal deviation.  Cardiovascular:     Rate and Rhythm: Normal rate.  Pulmonary:     Effort: Pulmonary effort is normal.  Abdominal:     Palpations: Abdomen is soft.  Skin:    General: Skin is warm and dry.  Neurological:     Mental Status: She is alert and oriented to person, place, and time.  Psychiatric:        Behavior: Behavior normal.     Ortho Exam patient is ambulating with  a limp.  Positive straight leg raise 70 degrees on the right leg -90 degrees straight leg raise left.  She has giving way of her right leg when she tries to toe walk also heel walk.  Decreased sensation L5 distribution right foot normal on the left.  Distal pulses palpable. Specialty Comments:  No specialty comments available.  Imaging: 1. Severe multifactorial spinal and lateral recess stenosis at L4-L5  related to grade 1 anterolisthesis, disc and severe posterior  element degeneration.  Bilateral synovial cysts also affect both the exiting right L4 and  the descending left L5 nerves.   2. Lesser spine degeneration elsewhere. Moderate left lateral recess  and left foraminal stenosis at L5-S1. Up to mild stenosis at L2-L3  and L3-L4.    Electronically Signed  By: Genevie Ann M.D.  On: 10/08/2020  16:15    PMFS History: Patient Active Problem List   Diagnosis Date Noted  . Carcinoid tumor 08/21/2019  . Essential tremor 08/01/2019  . Abdominal pain 08/01/2019  . Weight loss, non-intentional 08/01/2019  . Parkinsonism ([place]) 05/23/2018  . Fatigue 02/02/2018  . Essential hypertension 08/16/2016  . Mixed hyperlipidemia 08/16/2016  . Type 2 diabetes mellitus without complication, without long-term current use of insulin ([place]) 08/16/2016  . Iron deficiency anemia 04/18/2016  . Drug-induced hyperkalemia 04/18/2016  . Cerebrovascular disease 04/16/2016  . S/P cervical spinal fusion 09/03/2015  . PVD (peripheral vascular disease) ([place]) 05/05/2015  . Bilateral carotid artery stenosis 12/27/2014  . Presence of aortocoronary bypass graft 09/30/2014  . Coronary artery disease involving native coronary artery of native heart without angina pectoris 09/25/2014  . GERD (gastroesophageal reflux disease) 09/24/2014  . Abnormal stress test 09/24/2014  . Left carotid bruit 09/24/2014   Past Medical History:  Diagnosis Date  . Carotid stenosis    left ICA 60% 04/2015 (1 yr f/u rec, Dr. Glenford Bayley)  . Chronic lower back pain    "frequency depends upon how active I am" (09/02/2015)  . Coronary artery disease   . Daily headache    "related to pinched nerve" (09/03/2015)  . Depression    takes Paxil daily  . Dizziness    r/t neck issues  . GERD (gastroesophageal reflux disease)    takes Omeprazole daily  . Heart murmur   . Hyperlipidemia    takes Simvastatin daily  . Hypertension   . Migraine    "stopped when I went thru the change"  . Neck pain   . Pneumonia 1940's X 1  . Shakes    takes Propranolol daily  . Tingling    weakness and numbness in left arm  . Type II diabetes mellitus ([facility])    takes Glipizide and Metformin;Fasting sugar 105-125    No family history on file.  Past Surgical History:  Procedure Laterality Date  . ABDOMINAL HYSTERECTOMY    . ANTERIOR CERVICAL  DECOMP/DISCECTOMY FUSION  09/03/2015   C5-6  . ANTERIOR CERVICAL DECOMP/DISCECTOMY FUSION Bilateral 09/03/2015   Procedure: C5-6 Anterior Cervical Discectomy and Fusion, Allograft, Plate ;  Surgeon: Marybelle Killings, MD;  Location: [facility];  Service: Orthopedics;  Laterality: Bilateral;  . CARPAL TUNNEL RELEASE Right ?1980's  . CATARACT EXTRACTION, BILATERAL Bilateral 2013  . COLON SURGERY    . COLONOSCOPY    . CORONARY ARTERY BYPASS GRAFT  09/24/2014   CABG X 4  . CYSTOSCOPY WITH BIOPSY  12/14/2011   Procedure: CYSTOSCOPY WITH BIOPSY;  Surgeon: Marissa Nestle;  Location: [facility];  Service: Urology;  Laterality:  N/A;  Cystoscopy with Bladder Biopsies, Bladder Fulguration  . CYSTOSCOPY WITH HYDRODISTENSION AND BIOPSY N/A 08/30/2018   Procedure: CYSTOSCOPY/BIOPSY/HYDRODISTENSION AND FULGUURATION;  Surgeon: Lucas Mallow, MD;  Location: [facility];  Service: Urology;  Laterality: N/A;  . CYSTOSCOPY WITH URETHRAL DILATATION  12/14/2011   Procedure: CYSTOSCOPY WITH URETHRAL DILATATION;  Surgeon: Marissa Nestle;  Location: [facility];  Service: Urology;;  . ESOPHAGOGASTRODUODENOSCOPY    . LAPAROSCOPIC CHOLECYSTECTOMY  1990's  . TUBAL LIGATION  ~ 1968   Social History   Occupational History  . Not on file  Tobacco Use  . Smoking status: Never Smoker  . Smokeless tobacco: Never Used  Vaping Use  . Vaping Use: Never used  Substance and Sexual Activity  . Alcohol use: No  . Drug use: No  . Sexual activity: Not Currently    Birth control/protection: Post-menopausal

## 2020-10-10 DIAGNOSIS — E785 Hyperlipidemia, unspecified: Secondary | ICD-10-CM | POA: Diagnosis not present

## 2020-10-10 DIAGNOSIS — E119 Type 2 diabetes mellitus without complications: Secondary | ICD-10-CM | POA: Diagnosis not present

## 2020-10-22 DIAGNOSIS — I10 Essential (primary) hypertension: Secondary | ICD-10-CM | POA: Diagnosis not present

## 2020-10-22 DIAGNOSIS — Z0000 Encounter for general adult medical examination without abnormal findings: Secondary | ICD-10-CM | POA: Diagnosis not present

## 2020-10-22 DIAGNOSIS — M5431 Sciatica, right side: Secondary | ICD-10-CM | POA: Diagnosis not present

## 2020-10-22 DIAGNOSIS — E785 Hyperlipidemia, unspecified: Secondary | ICD-10-CM | POA: Diagnosis not present

## 2020-10-22 DIAGNOSIS — E1169 Type 2 diabetes mellitus with other specified complication: Secondary | ICD-10-CM | POA: Diagnosis not present

## 2020-10-22 DIAGNOSIS — Z6823 Body mass index (BMI) 23.0-23.9, adult: Secondary | ICD-10-CM | POA: Diagnosis not present

## 2020-11-14 DIAGNOSIS — E785 Hyperlipidemia, unspecified: Secondary | ICD-10-CM | POA: Diagnosis not present

## 2020-11-14 DIAGNOSIS — I10 Essential (primary) hypertension: Secondary | ICD-10-CM | POA: Diagnosis not present

## 2020-11-14 DIAGNOSIS — E1169 Type 2 diabetes mellitus with other specified complication: Secondary | ICD-10-CM | POA: Diagnosis not present

## 2020-12-22 DIAGNOSIS — I10 Essential (primary) hypertension: Secondary | ICD-10-CM | POA: Diagnosis not present

## 2020-12-22 DIAGNOSIS — E1169 Type 2 diabetes mellitus with other specified complication: Secondary | ICD-10-CM | POA: Diagnosis not present

## 2020-12-22 DIAGNOSIS — E785 Hyperlipidemia, unspecified: Secondary | ICD-10-CM | POA: Diagnosis not present

## 2021-01-22 DIAGNOSIS — M5431 Sciatica, right side: Secondary | ICD-10-CM | POA: Diagnosis not present

## 2021-01-22 DIAGNOSIS — I10 Essential (primary) hypertension: Secondary | ICD-10-CM | POA: Diagnosis not present

## 2021-01-22 DIAGNOSIS — E1169 Type 2 diabetes mellitus with other specified complication: Secondary | ICD-10-CM | POA: Diagnosis not present

## 2021-01-22 DIAGNOSIS — Z6824 Body mass index (BMI) 24.0-24.9, adult: Secondary | ICD-10-CM | POA: Diagnosis not present

## 2021-01-22 DIAGNOSIS — Z0000 Encounter for general adult medical examination without abnormal findings: Secondary | ICD-10-CM | POA: Diagnosis not present

## 2021-01-22 DIAGNOSIS — E785 Hyperlipidemia, unspecified: Secondary | ICD-10-CM | POA: Diagnosis not present

## 2021-02-06 DIAGNOSIS — H524 Presbyopia: Secondary | ICD-10-CM | POA: Diagnosis not present

## 2021-02-06 DIAGNOSIS — H40013 Open angle with borderline findings, low risk, bilateral: Secondary | ICD-10-CM | POA: Diagnosis not present

## 2021-02-18 DIAGNOSIS — I6522 Occlusion and stenosis of left carotid artery: Secondary | ICD-10-CM | POA: Diagnosis not present

## 2021-02-18 DIAGNOSIS — I6521 Occlusion and stenosis of right carotid artery: Secondary | ICD-10-CM | POA: Diagnosis not present

## 2021-02-18 DIAGNOSIS — T82856A Stenosis of peripheral vascular stent, initial encounter: Secondary | ICD-10-CM | POA: Diagnosis not present

## 2021-02-26 DIAGNOSIS — Z7982 Long term (current) use of aspirin: Secondary | ICD-10-CM | POA: Diagnosis not present

## 2021-02-26 DIAGNOSIS — E785 Hyperlipidemia, unspecified: Secondary | ICD-10-CM | POA: Diagnosis not present

## 2021-02-26 DIAGNOSIS — T82858A Stenosis of vascular prosthetic devices, implants and grafts, initial encounter: Secondary | ICD-10-CM | POA: Diagnosis not present

## 2021-02-26 DIAGNOSIS — T82898A Other specified complication of vascular prosthetic devices, implants and grafts, initial encounter: Secondary | ICD-10-CM | POA: Diagnosis not present

## 2021-02-26 DIAGNOSIS — Z794 Long term (current) use of insulin: Secondary | ICD-10-CM | POA: Diagnosis not present

## 2021-02-26 DIAGNOSIS — I6522 Occlusion and stenosis of left carotid artery: Secondary | ICD-10-CM | POA: Diagnosis not present

## 2021-02-26 DIAGNOSIS — Z79899 Other long term (current) drug therapy: Secondary | ICD-10-CM | POA: Diagnosis not present

## 2021-02-26 DIAGNOSIS — I10 Essential (primary) hypertension: Secondary | ICD-10-CM | POA: Diagnosis not present

## 2021-02-26 DIAGNOSIS — E119 Type 2 diabetes mellitus without complications: Secondary | ICD-10-CM | POA: Diagnosis not present

## 2021-02-26 DIAGNOSIS — K219 Gastro-esophageal reflux disease without esophagitis: Secondary | ICD-10-CM | POA: Diagnosis not present

## 2021-02-26 DIAGNOSIS — Z7902 Long term (current) use of antithrombotics/antiplatelets: Secondary | ICD-10-CM | POA: Diagnosis not present

## 2021-02-26 DIAGNOSIS — Z7984 Long term (current) use of oral hypoglycemic drugs: Secondary | ICD-10-CM | POA: Diagnosis not present

## 2021-04-01 DIAGNOSIS — I6522 Occlusion and stenosis of left carotid artery: Secondary | ICD-10-CM | POA: Diagnosis not present

## 2021-04-01 DIAGNOSIS — Z95828 Presence of other vascular implants and grafts: Secondary | ICD-10-CM | POA: Diagnosis not present

## 2021-04-16 DIAGNOSIS — M25559 Pain in unspecified hip: Secondary | ICD-10-CM | POA: Diagnosis not present

## 2021-04-16 DIAGNOSIS — G20 Parkinson's disease: Secondary | ICD-10-CM | POA: Diagnosis not present

## 2021-04-16 DIAGNOSIS — Z79899 Other long term (current) drug therapy: Secondary | ICD-10-CM | POA: Diagnosis not present

## 2021-04-16 DIAGNOSIS — M79606 Pain in leg, unspecified: Secondary | ICD-10-CM | POA: Diagnosis not present

## 2021-04-16 DIAGNOSIS — R5383 Other fatigue: Secondary | ICD-10-CM | POA: Diagnosis not present

## 2021-04-27 DIAGNOSIS — E785 Hyperlipidemia, unspecified: Secondary | ICD-10-CM | POA: Diagnosis not present

## 2021-04-27 DIAGNOSIS — Z6823 Body mass index (BMI) 23.0-23.9, adult: Secondary | ICD-10-CM | POA: Diagnosis not present

## 2021-04-27 DIAGNOSIS — M5431 Sciatica, right side: Secondary | ICD-10-CM | POA: Diagnosis not present

## 2021-04-27 DIAGNOSIS — E1169 Type 2 diabetes mellitus with other specified complication: Secondary | ICD-10-CM | POA: Diagnosis not present

## 2021-04-27 DIAGNOSIS — I10 Essential (primary) hypertension: Secondary | ICD-10-CM | POA: Diagnosis not present

## 2021-04-27 DIAGNOSIS — Z0000 Encounter for general adult medical examination without abnormal findings: Secondary | ICD-10-CM | POA: Diagnosis not present

## 2021-06-03 DIAGNOSIS — Z52001 Unspecified donor, stem cells: Secondary | ICD-10-CM | POA: Diagnosis not present

## 2021-06-03 DIAGNOSIS — Z1159 Encounter for screening for other viral diseases: Secondary | ICD-10-CM | POA: Diagnosis not present

## 2021-07-06 ENCOUNTER — Other Ambulatory Visit: Payer: Self-pay | Admitting: Internal Medicine

## 2021-07-06 DIAGNOSIS — G8929 Other chronic pain: Secondary | ICD-10-CM

## 2021-07-10 ENCOUNTER — Ambulatory Visit
Admission: RE | Admit: 2021-07-10 | Discharge: 2021-07-10 | Disposition: A | Discharge status: Home / Self Care | Payer: Medicare Other | Source: Ambulatory Visit | Attending: Internal Medicine | Admitting: Internal Medicine

## 2021-07-10 ENCOUNTER — Other Ambulatory Visit: Payer: Self-pay

## 2021-07-10 DIAGNOSIS — M545 Low back pain, unspecified: Secondary | ICD-10-CM

## 2021-07-10 DIAGNOSIS — M48061 Spinal stenosis, lumbar region without neurogenic claudication: Secondary | ICD-10-CM | POA: Diagnosis not present

## 2021-07-10 DIAGNOSIS — G8929 Other chronic pain: Secondary | ICD-10-CM

## 2021-07-10 DIAGNOSIS — M7138 Other bursal cyst, other site: Secondary | ICD-10-CM | POA: Diagnosis not present

## 2021-07-10 MED ORDER — METHYLPREDNISOLONE ACETATE 40 MG/ML INJ SUSP (RADIOLOG
80.0000 mg | Freq: Once | INTRAMUSCULAR | Status: AC
  Administered 2021-07-10: 80 mg via EPIDURAL

## 2021-07-10 MED ORDER — IOPAMIDOL (ISOVUE-M 200) INJECTION 41%
1.0000 mL | Freq: Once | INTRAMUSCULAR | Status: AC
  Administered 2021-07-10: 1 mL via EPIDURAL

## 2021-07-10 NOTE — Discharge Instructions (Signed)
Post Procedure Spinal Discharge Instruction Sheet  You may resume a regular diet and any medications that you routinely take (including pain medications) unless otherwise noted by MD.  No driving day of procedure.  Light activity throughout the rest of the day.  Do not do any strenuous work, exercise, bending or lifting.  The day following the procedure, you can resume normal physical activity but you should refrain from exercising or physical therapy for at least three days thereafter.  You may apply ice to the injection site, 20 minutes on, 20 minutes off, as needed. Do not apply ice directly to skin.    Common Side Effects:  Headaches- take your usual medications as directed by your physician.  Increase your fluid intake.  Caffeinated beverages may be helpful.  Lie flat in bed until your headache resolves.  Restlessness or inability to sleep- you may have trouble sleeping for the next few days.  Ask your referring physician if you need any medication for sleep.  Facial flushing or redness- should subside within a few days.  Increased pain- a temporary increase in pain a day or two following your procedure is not unusual.  Take your pain medication as prescribed by your referring physician.  Leg cramps  Please contact our office at [phone] for the following symptoms: Fever greater than 100 degrees. Headaches unresolved with medication after 2-3 days. Increased swelling, pain, or redness at injection site.   Thank you for visiting [facility] today.    YOU MAY RESUME YOUR ASPIRIN ANYTIME AFTER INJECTION TODAY 07/10/21

## 2021-07-24 ENCOUNTER — Other Ambulatory Visit: Payer: Self-pay | Admitting: Internal Medicine

## 2021-07-24 DIAGNOSIS — M545 Low back pain, unspecified: Secondary | ICD-10-CM

## 2021-07-24 DIAGNOSIS — G8929 Other chronic pain: Secondary | ICD-10-CM

## 2021-07-28 ENCOUNTER — Ambulatory Visit
Admission: RE | Admit: 2021-07-28 | Discharge: 2021-07-28 | Disposition: A | Discharge status: Home / Self Care | Payer: Medicare Other | Source: Ambulatory Visit | Attending: Internal Medicine | Admitting: Internal Medicine

## 2021-07-28 ENCOUNTER — Other Ambulatory Visit: Payer: Self-pay | Admitting: Internal Medicine

## 2021-07-28 ENCOUNTER — Other Ambulatory Visit: Payer: Self-pay

## 2021-07-28 DIAGNOSIS — M48061 Spinal stenosis, lumbar region without neurogenic claudication: Secondary | ICD-10-CM | POA: Diagnosis not present

## 2021-07-28 DIAGNOSIS — G8929 Other chronic pain: Secondary | ICD-10-CM

## 2021-07-28 DIAGNOSIS — M545 Low back pain, unspecified: Secondary | ICD-10-CM

## 2021-07-28 DIAGNOSIS — M47817 Spondylosis without myelopathy or radiculopathy, lumbosacral region: Secondary | ICD-10-CM | POA: Diagnosis not present

## 2021-07-28 MED ORDER — IOPAMIDOL (ISOVUE-M 200) INJECTION 41%
1.0000 mL | Freq: Once | INTRAMUSCULAR | Status: AC
  Administered 2021-07-28: 1 mL via EPIDURAL

## 2021-07-28 MED ORDER — METHYLPREDNISOLONE ACETATE 40 MG/ML INJ SUSP (RADIOLOG
80.0000 mg | Freq: Once | INTRAMUSCULAR | Status: AC
  Administered 2021-07-28: 80 mg via EPIDURAL

## 2021-07-28 NOTE — Discharge Instructions (Signed)

## 2021-08-03 DIAGNOSIS — Z0000 Encounter for general adult medical examination without abnormal findings: Secondary | ICD-10-CM | POA: Diagnosis not present

## 2021-08-03 DIAGNOSIS — L570 Actinic keratosis: Secondary | ICD-10-CM | POA: Diagnosis not present

## 2021-08-03 DIAGNOSIS — Z6824 Body mass index (BMI) 24.0-24.9, adult: Secondary | ICD-10-CM | POA: Diagnosis not present

## 2021-08-03 DIAGNOSIS — E1169 Type 2 diabetes mellitus with other specified complication: Secondary | ICD-10-CM | POA: Diagnosis not present

## 2021-08-03 DIAGNOSIS — I10 Essential (primary) hypertension: Secondary | ICD-10-CM | POA: Diagnosis not present

## 2021-08-03 DIAGNOSIS — M5431 Sciatica, right side: Secondary | ICD-10-CM | POA: Diagnosis not present

## 2021-08-03 DIAGNOSIS — E785 Hyperlipidemia, unspecified: Secondary | ICD-10-CM | POA: Diagnosis not present

## 2021-08-10 DIAGNOSIS — E782 Mixed hyperlipidemia: Secondary | ICD-10-CM | POA: Diagnosis not present

## 2021-08-10 DIAGNOSIS — Z951 Presence of aortocoronary bypass graft: Secondary | ICD-10-CM | POA: Diagnosis not present

## 2021-08-10 DIAGNOSIS — I10 Essential (primary) hypertension: Secondary | ICD-10-CM | POA: Diagnosis not present

## 2021-08-10 DIAGNOSIS — I2510 Atherosclerotic heart disease of native coronary artery without angina pectoris: Secondary | ICD-10-CM | POA: Diagnosis not present

## 2021-08-23 DIAGNOSIS — Z794 Long term (current) use of insulin: Secondary | ICD-10-CM | POA: Diagnosis not present

## 2021-08-23 DIAGNOSIS — N189 Chronic kidney disease, unspecified: Secondary | ICD-10-CM | POA: Diagnosis not present

## 2021-08-23 DIAGNOSIS — R059 Cough, unspecified: Secondary | ICD-10-CM | POA: Diagnosis not present

## 2021-08-23 DIAGNOSIS — I2510 Atherosclerotic heart disease of native coronary artery without angina pectoris: Secondary | ICD-10-CM | POA: Diagnosis not present

## 2021-08-23 DIAGNOSIS — E1122 Type 2 diabetes mellitus with diabetic chronic kidney disease: Secondary | ICD-10-CM | POA: Diagnosis not present

## 2021-08-23 DIAGNOSIS — R519 Headache, unspecified: Secondary | ICD-10-CM | POA: Diagnosis not present

## 2021-08-23 DIAGNOSIS — E785 Hyperlipidemia, unspecified: Secondary | ICD-10-CM | POA: Diagnosis not present

## 2021-08-23 DIAGNOSIS — Z20822 Contact with and (suspected) exposure to covid-19: Secondary | ICD-10-CM | POA: Diagnosis not present

## 2021-08-23 DIAGNOSIS — Z7984 Long term (current) use of oral hypoglycemic drugs: Secondary | ICD-10-CM | POA: Diagnosis not present

## 2021-08-23 DIAGNOSIS — I129 Hypertensive chronic kidney disease with stage 1 through stage 4 chronic kidney disease, or unspecified chronic kidney disease: Secondary | ICD-10-CM | POA: Diagnosis not present

## 2021-08-23 DIAGNOSIS — J029 Acute pharyngitis, unspecified: Secondary | ICD-10-CM | POA: Diagnosis not present

## 2021-08-23 DIAGNOSIS — Z951 Presence of aortocoronary bypass graft: Secondary | ICD-10-CM | POA: Diagnosis not present

## 2021-08-23 DIAGNOSIS — Z79899 Other long term (current) drug therapy: Secondary | ICD-10-CM | POA: Diagnosis not present

## 2021-10-07 DIAGNOSIS — Z79899 Other long term (current) drug therapy: Secondary | ICD-10-CM | POA: Diagnosis not present

## 2021-10-07 DIAGNOSIS — G20 Parkinson's disease: Secondary | ICD-10-CM | POA: Diagnosis not present

## 2021-10-07 DIAGNOSIS — R269 Unspecified abnormalities of gait and mobility: Secondary | ICD-10-CM | POA: Diagnosis not present

## 2021-10-07 DIAGNOSIS — W19XXXA Unspecified fall, initial encounter: Secondary | ICD-10-CM | POA: Diagnosis not present

## 2021-10-07 DIAGNOSIS — E1140 Type 2 diabetes mellitus with diabetic neuropathy, unspecified: Secondary | ICD-10-CM | POA: Diagnosis not present

## 2021-10-07 DIAGNOSIS — R29898 Other symptoms and signs involving the musculoskeletal system: Secondary | ICD-10-CM | POA: Diagnosis not present

## 2021-10-07 DIAGNOSIS — G250 Essential tremor: Secondary | ICD-10-CM | POA: Diagnosis not present

## 2021-10-07 DIAGNOSIS — R519 Headache, unspecified: Secondary | ICD-10-CM | POA: Diagnosis not present

## 2021-10-09 DIAGNOSIS — G8911 Acute pain due to trauma: Secondary | ICD-10-CM | POA: Diagnosis not present

## 2021-10-09 DIAGNOSIS — R519 Headache, unspecified: Secondary | ICD-10-CM | POA: Diagnosis not present

## 2021-10-12 DIAGNOSIS — T82856A Stenosis of peripheral vascular stent, initial encounter: Secondary | ICD-10-CM | POA: Diagnosis not present

## 2021-10-12 DIAGNOSIS — I6523 Occlusion and stenosis of bilateral carotid arteries: Secondary | ICD-10-CM | POA: Diagnosis not present

## 2021-10-12 DIAGNOSIS — I6521 Occlusion and stenosis of right carotid artery: Secondary | ICD-10-CM | POA: Diagnosis not present

## 2021-11-10 DIAGNOSIS — E785 Hyperlipidemia, unspecified: Secondary | ICD-10-CM | POA: Diagnosis not present

## 2021-11-10 DIAGNOSIS — M5431 Sciatica, right side: Secondary | ICD-10-CM | POA: Diagnosis not present

## 2021-11-10 DIAGNOSIS — I10 Essential (primary) hypertension: Secondary | ICD-10-CM | POA: Diagnosis not present

## 2021-11-10 DIAGNOSIS — Z6824 Body mass index (BMI) 24.0-24.9, adult: Secondary | ICD-10-CM | POA: Diagnosis not present

## 2021-11-10 DIAGNOSIS — E1121 Type 2 diabetes mellitus with diabetic nephropathy: Secondary | ICD-10-CM | POA: Diagnosis not present

## 2021-11-21 DIAGNOSIS — E875 Hyperkalemia: Secondary | ICD-10-CM | POA: Diagnosis not present

## 2021-11-21 DIAGNOSIS — R531 Weakness: Secondary | ICD-10-CM | POA: Diagnosis not present

## 2021-11-21 DIAGNOSIS — E785 Hyperlipidemia, unspecified: Secondary | ICD-10-CM | POA: Diagnosis not present

## 2021-11-21 DIAGNOSIS — R6883 Chills (without fever): Secondary | ICD-10-CM | POA: Diagnosis not present

## 2021-11-21 DIAGNOSIS — R509 Fever, unspecified: Secondary | ICD-10-CM | POA: Diagnosis not present

## 2021-11-21 DIAGNOSIS — Z20822 Contact with and (suspected) exposure to covid-19: Secondary | ICD-10-CM | POA: Diagnosis not present

## 2021-11-21 DIAGNOSIS — I2510 Atherosclerotic heart disease of native coronary artery without angina pectoris: Secondary | ICD-10-CM | POA: Diagnosis not present

## 2021-11-21 DIAGNOSIS — I129 Hypertensive chronic kidney disease with stage 1 through stage 4 chronic kidney disease, or unspecified chronic kidney disease: Secondary | ICD-10-CM | POA: Diagnosis not present

## 2021-11-21 DIAGNOSIS — M791 Myalgia, unspecified site: Secondary | ICD-10-CM | POA: Diagnosis not present

## 2021-11-21 DIAGNOSIS — R519 Headache, unspecified: Secondary | ICD-10-CM | POA: Diagnosis not present

## 2021-11-21 DIAGNOSIS — E1122 Type 2 diabetes mellitus with diabetic chronic kidney disease: Secondary | ICD-10-CM | POA: Diagnosis not present

## 2021-11-21 DIAGNOSIS — N390 Urinary tract infection, site not specified: Secondary | ICD-10-CM | POA: Diagnosis not present

## 2021-11-21 DIAGNOSIS — I6523 Occlusion and stenosis of bilateral carotid arteries: Secondary | ICD-10-CM | POA: Diagnosis not present

## 2021-11-21 DIAGNOSIS — N189 Chronic kidney disease, unspecified: Secondary | ICD-10-CM | POA: Diagnosis not present

## 2021-11-21 DIAGNOSIS — R9431 Abnormal electrocardiogram [ECG] [EKG]: Secondary | ICD-10-CM | POA: Diagnosis not present

## 2021-12-04 DIAGNOSIS — G6289 Other specified polyneuropathies: Secondary | ICD-10-CM | POA: Diagnosis not present

## 2021-12-04 DIAGNOSIS — R29898 Other symptoms and signs involving the musculoskeletal system: Secondary | ICD-10-CM | POA: Diagnosis not present

## 2022-02-16 DIAGNOSIS — M5431 Sciatica, right side: Secondary | ICD-10-CM | POA: Diagnosis not present

## 2022-02-16 DIAGNOSIS — E785 Hyperlipidemia, unspecified: Secondary | ICD-10-CM | POA: Diagnosis not present

## 2022-02-16 DIAGNOSIS — E1121 Type 2 diabetes mellitus with diabetic nephropathy: Secondary | ICD-10-CM | POA: Diagnosis not present

## 2022-02-16 DIAGNOSIS — Z0000 Encounter for general adult medical examination without abnormal findings: Secondary | ICD-10-CM | POA: Diagnosis not present

## 2022-02-16 DIAGNOSIS — Z6824 Body mass index (BMI) 24.0-24.9, adult: Secondary | ICD-10-CM | POA: Diagnosis not present

## 2022-02-16 DIAGNOSIS — I10 Essential (primary) hypertension: Secondary | ICD-10-CM | POA: Diagnosis not present

## 2022-04-26 DIAGNOSIS — I6522 Occlusion and stenosis of left carotid artery: Secondary | ICD-10-CM | POA: Diagnosis not present

## 2022-04-26 DIAGNOSIS — Z95828 Presence of other vascular implants and grafts: Secondary | ICD-10-CM | POA: Diagnosis not present

## 2022-05-04 DIAGNOSIS — M5431 Sciatica, right side: Secondary | ICD-10-CM | POA: Diagnosis not present

## 2022-05-04 DIAGNOSIS — M5459 Other low back pain: Secondary | ICD-10-CM | POA: Diagnosis not present

## 2022-05-05 DIAGNOSIS — M5431 Sciatica, right side: Secondary | ICD-10-CM | POA: Diagnosis not present

## 2022-05-05 DIAGNOSIS — M5459 Other low back pain: Secondary | ICD-10-CM | POA: Diagnosis not present

## 2022-05-11 DIAGNOSIS — M5459 Other low back pain: Secondary | ICD-10-CM | POA: Diagnosis not present

## 2022-05-11 DIAGNOSIS — M5431 Sciatica, right side: Secondary | ICD-10-CM | POA: Diagnosis not present

## 2022-05-13 DIAGNOSIS — M5459 Other low back pain: Secondary | ICD-10-CM | POA: Diagnosis not present

## 2022-05-13 DIAGNOSIS — M5431 Sciatica, right side: Secondary | ICD-10-CM | POA: Diagnosis not present

## 2022-05-18 DIAGNOSIS — Z6823 Body mass index (BMI) 23.0-23.9, adult: Secondary | ICD-10-CM | POA: Diagnosis not present

## 2022-05-18 DIAGNOSIS — E1121 Type 2 diabetes mellitus with diabetic nephropathy: Secondary | ICD-10-CM | POA: Diagnosis not present

## 2022-05-18 DIAGNOSIS — Z0000 Encounter for general adult medical examination without abnormal findings: Secondary | ICD-10-CM | POA: Diagnosis not present

## 2022-05-18 DIAGNOSIS — E785 Hyperlipidemia, unspecified: Secondary | ICD-10-CM | POA: Diagnosis not present

## 2022-05-18 DIAGNOSIS — M5431 Sciatica, right side: Secondary | ICD-10-CM | POA: Diagnosis not present

## 2022-05-18 DIAGNOSIS — I10 Essential (primary) hypertension: Secondary | ICD-10-CM | POA: Diagnosis not present

## 2022-05-19 DIAGNOSIS — M5459 Other low back pain: Secondary | ICD-10-CM | POA: Diagnosis not present

## 2022-05-19 DIAGNOSIS — M5431 Sciatica, right side: Secondary | ICD-10-CM | POA: Diagnosis not present

## 2022-05-20 DIAGNOSIS — M5459 Other low back pain: Secondary | ICD-10-CM | POA: Diagnosis not present

## 2022-05-20 DIAGNOSIS — M5431 Sciatica, right side: Secondary | ICD-10-CM | POA: Diagnosis not present

## 2022-05-25 DIAGNOSIS — M5459 Other low back pain: Secondary | ICD-10-CM | POA: Diagnosis not present

## 2022-05-25 DIAGNOSIS — M5431 Sciatica, right side: Secondary | ICD-10-CM | POA: Diagnosis not present

## 2022-05-27 DIAGNOSIS — M5459 Other low back pain: Secondary | ICD-10-CM | POA: Diagnosis not present

## 2022-05-27 DIAGNOSIS — M5431 Sciatica, right side: Secondary | ICD-10-CM | POA: Diagnosis not present

## 2022-06-08 DIAGNOSIS — M5431 Sciatica, right side: Secondary | ICD-10-CM | POA: Diagnosis not present

## 2022-06-08 DIAGNOSIS — M5459 Other low back pain: Secondary | ICD-10-CM | POA: Diagnosis not present

## 2022-06-10 DIAGNOSIS — M5459 Other low back pain: Secondary | ICD-10-CM | POA: Diagnosis not present

## 2022-06-10 DIAGNOSIS — M5431 Sciatica, right side: Secondary | ICD-10-CM | POA: Diagnosis not present

## 2022-06-12 DIAGNOSIS — Z1231 Encounter for screening mammogram for malignant neoplasm of breast: Secondary | ICD-10-CM | POA: Diagnosis not present

## 2022-06-15 DIAGNOSIS — M5431 Sciatica, right side: Secondary | ICD-10-CM | POA: Diagnosis not present

## 2022-06-15 DIAGNOSIS — M5459 Other low back pain: Secondary | ICD-10-CM | POA: Diagnosis not present

## 2022-06-17 DIAGNOSIS — M5431 Sciatica, right side: Secondary | ICD-10-CM | POA: Diagnosis not present

## 2022-06-17 DIAGNOSIS — M5459 Other low back pain: Secondary | ICD-10-CM | POA: Diagnosis not present

## 2022-07-07 DIAGNOSIS — M5459 Other low back pain: Secondary | ICD-10-CM | POA: Diagnosis not present

## 2022-07-07 DIAGNOSIS — M5431 Sciatica, right side: Secondary | ICD-10-CM | POA: Diagnosis not present

## 2022-07-13 DIAGNOSIS — M5459 Other low back pain: Secondary | ICD-10-CM | POA: Diagnosis not present

## 2022-07-13 DIAGNOSIS — M5431 Sciatica, right side: Secondary | ICD-10-CM | POA: Diagnosis not present

## 2022-07-28 DIAGNOSIS — M5459 Other low back pain: Secondary | ICD-10-CM | POA: Diagnosis not present

## 2022-07-28 DIAGNOSIS — M5431 Sciatica, right side: Secondary | ICD-10-CM | POA: Diagnosis not present

## 2022-08-03 DIAGNOSIS — M5431 Sciatica, right side: Secondary | ICD-10-CM | POA: Diagnosis not present

## 2022-08-03 DIAGNOSIS — M5459 Other low back pain: Secondary | ICD-10-CM | POA: Diagnosis not present

## 2022-08-13 DIAGNOSIS — M5431 Sciatica, right side: Secondary | ICD-10-CM | POA: Diagnosis not present

## 2022-08-13 DIAGNOSIS — M5459 Other low back pain: Secondary | ICD-10-CM | POA: Diagnosis not present

## 2022-08-25 DIAGNOSIS — E1121 Type 2 diabetes mellitus with diabetic nephropathy: Secondary | ICD-10-CM | POA: Diagnosis not present

## 2022-08-25 DIAGNOSIS — I10 Essential (primary) hypertension: Secondary | ICD-10-CM | POA: Diagnosis not present

## 2022-08-25 DIAGNOSIS — Z6821 Body mass index (BMI) 21.0-21.9, adult: Secondary | ICD-10-CM | POA: Diagnosis not present

## 2022-08-25 DIAGNOSIS — M5431 Sciatica, right side: Secondary | ICD-10-CM | POA: Diagnosis not present

## 2022-08-25 DIAGNOSIS — E785 Hyperlipidemia, unspecified: Secondary | ICD-10-CM | POA: Diagnosis not present

## 2022-11-02 DIAGNOSIS — E782 Mixed hyperlipidemia: Secondary | ICD-10-CM | POA: Diagnosis not present

## 2022-11-02 DIAGNOSIS — Z951 Presence of aortocoronary bypass graft: Secondary | ICD-10-CM | POA: Diagnosis not present

## 2022-11-02 DIAGNOSIS — I10 Essential (primary) hypertension: Secondary | ICD-10-CM | POA: Diagnosis not present

## 2022-11-02 DIAGNOSIS — R0989 Other specified symptoms and signs involving the circulatory and respiratory systems: Secondary | ICD-10-CM | POA: Diagnosis not present

## 2022-11-02 DIAGNOSIS — I2510 Atherosclerotic heart disease of native coronary artery without angina pectoris: Secondary | ICD-10-CM | POA: Diagnosis not present

## 2022-11-03 DIAGNOSIS — G250 Essential tremor: Secondary | ICD-10-CM | POA: Diagnosis not present

## 2022-11-03 DIAGNOSIS — Z794 Long term (current) use of insulin: Secondary | ICD-10-CM | POA: Diagnosis not present

## 2022-11-03 DIAGNOSIS — Z79899 Other long term (current) drug therapy: Secondary | ICD-10-CM | POA: Diagnosis not present

## 2022-11-03 DIAGNOSIS — E1140 Type 2 diabetes mellitus with diabetic neuropathy, unspecified: Secondary | ICD-10-CM | POA: Diagnosis not present

## 2022-11-03 DIAGNOSIS — R251 Tremor, unspecified: Secondary | ICD-10-CM | POA: Diagnosis not present

## 2022-12-02 DIAGNOSIS — I10 Essential (primary) hypertension: Secondary | ICD-10-CM | POA: Diagnosis not present

## 2022-12-02 DIAGNOSIS — M5431 Sciatica, right side: Secondary | ICD-10-CM | POA: Diagnosis not present

## 2022-12-02 DIAGNOSIS — E1121 Type 2 diabetes mellitus with diabetic nephropathy: Secondary | ICD-10-CM | POA: Diagnosis not present

## 2022-12-02 DIAGNOSIS — E785 Hyperlipidemia, unspecified: Secondary | ICD-10-CM | POA: Diagnosis not present

## 2022-12-02 DIAGNOSIS — T7523XD Vertigo from infrasound, subsequent encounter: Secondary | ICD-10-CM | POA: Diagnosis not present

## 2022-12-02 DIAGNOSIS — Z6821 Body mass index (BMI) 21.0-21.9, adult: Secondary | ICD-10-CM | POA: Diagnosis not present

## 2022-12-02 DIAGNOSIS — N3000 Acute cystitis without hematuria: Secondary | ICD-10-CM | POA: Diagnosis not present

## 2022-12-17 DIAGNOSIS — I2510 Atherosclerotic heart disease of native coronary artery without angina pectoris: Secondary | ICD-10-CM | POA: Diagnosis not present

## 2022-12-17 DIAGNOSIS — E782 Mixed hyperlipidemia: Secondary | ICD-10-CM | POA: Diagnosis not present

## 2022-12-17 DIAGNOSIS — I10 Essential (primary) hypertension: Secondary | ICD-10-CM | POA: Diagnosis not present

## 2023-03-07 DIAGNOSIS — Z6821 Body mass index (BMI) 21.0-21.9, adult: Secondary | ICD-10-CM | POA: Diagnosis not present

## 2023-03-07 DIAGNOSIS — E1121 Type 2 diabetes mellitus with diabetic nephropathy: Secondary | ICD-10-CM | POA: Diagnosis not present

## 2023-03-07 DIAGNOSIS — T7523XD Vertigo from infrasound, subsequent encounter: Secondary | ICD-10-CM | POA: Diagnosis not present

## 2023-03-07 DIAGNOSIS — E785 Hyperlipidemia, unspecified: Secondary | ICD-10-CM | POA: Diagnosis not present

## 2023-03-07 DIAGNOSIS — N3000 Acute cystitis without hematuria: Secondary | ICD-10-CM | POA: Diagnosis not present

## 2023-03-07 DIAGNOSIS — M5431 Sciatica, right side: Secondary | ICD-10-CM | POA: Diagnosis not present

## 2023-03-07 DIAGNOSIS — I10 Essential (primary) hypertension: Secondary | ICD-10-CM | POA: Diagnosis not present

## 2023-03-07 DIAGNOSIS — Z0000 Encounter for general adult medical examination without abnormal findings: Secondary | ICD-10-CM | POA: Diagnosis not present

## 2023-04-25 DIAGNOSIS — I6522 Occlusion and stenosis of left carotid artery: Secondary | ICD-10-CM | POA: Diagnosis not present

## 2023-05-09 DIAGNOSIS — M810 Age-related osteoporosis without current pathological fracture: Secondary | ICD-10-CM | POA: Diagnosis not present

## 2023-05-09 DIAGNOSIS — M8589 Other specified disorders of bone density and structure, multiple sites: Secondary | ICD-10-CM | POA: Diagnosis not present

## 2023-06-06 DIAGNOSIS — N182 Chronic kidney disease, stage 2 (mild): Secondary | ICD-10-CM | POA: Diagnosis not present

## 2023-06-06 DIAGNOSIS — Z0000 Encounter for general adult medical examination without abnormal findings: Secondary | ICD-10-CM | POA: Diagnosis not present

## 2023-06-06 DIAGNOSIS — Z6821 Body mass index (BMI) 21.0-21.9, adult: Secondary | ICD-10-CM | POA: Diagnosis not present

## 2023-06-06 DIAGNOSIS — E785 Hyperlipidemia, unspecified: Secondary | ICD-10-CM | POA: Diagnosis not present

## 2023-06-06 DIAGNOSIS — M5431 Sciatica, right side: Secondary | ICD-10-CM | POA: Diagnosis not present

## 2023-06-06 DIAGNOSIS — I10 Essential (primary) hypertension: Secondary | ICD-10-CM | POA: Diagnosis not present

## 2023-06-06 DIAGNOSIS — E1121 Type 2 diabetes mellitus with diabetic nephropathy: Secondary | ICD-10-CM | POA: Diagnosis not present

## 2023-06-16 DIAGNOSIS — H35033 Hypertensive retinopathy, bilateral: Secondary | ICD-10-CM | POA: Diagnosis not present

## 2023-06-16 DIAGNOSIS — G510 Bell's palsy: Secondary | ICD-10-CM | POA: Diagnosis not present

## 2023-06-16 DIAGNOSIS — E119 Type 2 diabetes mellitus without complications: Secondary | ICD-10-CM | POA: Diagnosis not present

## 2023-06-16 DIAGNOSIS — H40023 Open angle with borderline findings, high risk, bilateral: Secondary | ICD-10-CM | POA: Diagnosis not present

## 2023-09-13 DIAGNOSIS — E1121 Type 2 diabetes mellitus with diabetic nephropathy: Secondary | ICD-10-CM | POA: Diagnosis not present

## 2023-09-13 DIAGNOSIS — E785 Hyperlipidemia, unspecified: Secondary | ICD-10-CM | POA: Diagnosis not present

## 2023-09-13 DIAGNOSIS — N182 Chronic kidney disease, stage 2 (mild): Secondary | ICD-10-CM | POA: Diagnosis not present

## 2023-09-13 DIAGNOSIS — I10 Essential (primary) hypertension: Secondary | ICD-10-CM | POA: Diagnosis not present

## 2023-09-13 DIAGNOSIS — M5431 Sciatica, right side: Secondary | ICD-10-CM | POA: Diagnosis not present

## 2023-09-13 DIAGNOSIS — Z6821 Body mass index (BMI) 21.0-21.9, adult: Secondary | ICD-10-CM | POA: Diagnosis not present

## 2023-09-14 ENCOUNTER — Emergency Department (HOSPITAL_COMMUNITY): Payer: Medicare Other

## 2023-09-14 ENCOUNTER — Emergency Department (HOSPITAL_COMMUNITY)
Admission: EM | Admit: 2023-09-14 | Discharge: 2023-09-14 | Disposition: A | Discharge status: Home / Self Care | Payer: Medicare Other | Attending: Emergency Medicine | Admitting: Emergency Medicine

## 2023-09-14 ENCOUNTER — Encounter (HOSPITAL_COMMUNITY): Payer: Self-pay | Admitting: *Deleted

## 2023-09-14 ENCOUNTER — Other Ambulatory Visit: Payer: Self-pay

## 2023-09-14 DIAGNOSIS — Z951 Presence of aortocoronary bypass graft: Secondary | ICD-10-CM | POA: Insufficient documentation

## 2023-09-14 DIAGNOSIS — E119 Type 2 diabetes mellitus without complications: Secondary | ICD-10-CM | POA: Diagnosis not present

## 2023-09-14 DIAGNOSIS — Z7982 Long term (current) use of aspirin: Secondary | ICD-10-CM | POA: Insufficient documentation

## 2023-09-14 DIAGNOSIS — I209 Angina pectoris, unspecified: Secondary | ICD-10-CM | POA: Insufficient documentation

## 2023-09-14 DIAGNOSIS — Z7984 Long term (current) use of oral hypoglycemic drugs: Secondary | ICD-10-CM | POA: Insufficient documentation

## 2023-09-14 DIAGNOSIS — Z794 Long term (current) use of insulin: Secondary | ICD-10-CM | POA: Diagnosis not present

## 2023-09-14 DIAGNOSIS — R079 Chest pain, unspecified: Secondary | ICD-10-CM | POA: Diagnosis not present

## 2023-09-14 DIAGNOSIS — I700 Atherosclerosis of aorta: Secondary | ICD-10-CM | POA: Diagnosis not present

## 2023-09-14 LAB — TROPONIN I (HIGH SENSITIVITY)
Troponin I (High Sensitivity): 4 ng/L (ref <18)
Troponin I (High Sensitivity): 4 ng/L (ref <18)

## 2023-09-14 LAB — BASIC METABOLIC PANEL WITH GFR
Anion gap: 9 (ref 5–15)
BUN: 23 mg/dL (ref 8–23)
CO2: 25 mmol/L (ref 22–32)
Calcium: 9.3 mg/dL (ref 8.9–10.3)
Chloride: 100 mmol/L (ref 98–111)
Creatinine, Ser: 0.64 mg/dL (ref 0.44–1.00)
GFR, Estimated: >60 mL/min (ref >60)
Glucose, Bld: 119 mg/dL — ABNORMAL HIGH (ref 70–99)
Potassium: 5.3 mmol/L — ABNORMAL HIGH (ref 3.5–5.1)
Sodium: 134 mmol/L — ABNORMAL LOW (ref 135–145)

## 2023-09-14 LAB — CBC
HCT: 37.8 % (ref 36.0–46.0)
Hemoglobin: 11.4 g/dL — ABNORMAL LOW (ref 12.0–15.0)
MCH: 28.3 pg (ref 26.0–34.0)
MCHC: 30.2 g/dL (ref 30.0–36.0)
MCV: 93.8 fL (ref 80.0–100.0)
Platelets: 273 10*3/uL (ref 150–400)
RBC: 4.03 MIL/uL (ref 3.87–5.11)
RDW: 13.4 % (ref 11.5–15.5)
WBC: 7.8 10*3/uL (ref 4.0–10.5)
nRBC: 0 % (ref 0.0–0.2)

## 2023-09-14 NOTE — Discharge Instructions (Signed)
We saw you in the ER for the chest pain, arm pain.  All of our cardiac workup is normal, including labs, EKG and chest X-RAY are normal.  Which means that you have not suffered a heart attack in the last few days.  The workup in the ER is not complete, and you should follow up with your cardiologist for optimal management and diagnostic workup.  Please call them immediately for an appointment in 1 week.  Please return to the ER if you have worsening chest pain, shortness of breath, pain radiating to your jaw, shoulder, or back, sweats or fainting.

## 2023-09-14 NOTE — ED Notes (Signed)
Went over [employer]. All questions answered. Denied needing wheelchair.

## 2023-09-14 NOTE — ED Provider Triage Note (Signed)
Emergency Medicine Provider Triage Evaluation Note  Alexa Mcpherson , a 82 y.o. female  was evaluated in triage.  Pt complains of central chest pain and pain between the shoulder blades for the past 2-3 months.  Patient states the pain has recently started radiating down her left shoulder and arm.  She has shortness of breath with her chest pain.  Over the last 1-2 days she has had worsening exertional dyspnea and weakness.  Does have cardiac history.  Denies syncope.  Patient lost her son a few weeks ago which did make symptoms worse also.  Review of Systems  Positive: As above Negative: As above  Physical Exam  BP (!) 189/71 (BP Location: Right Arm)   Pulse 73   Temp 98.7 F (37.1 C) (Oral)   Resp 18   Ht 5\' 1"  (1.549 m)   Wt 54.4 kg   SpO2 97%   BMI 22.67 kg/m  Gen:   Awake, no distress   Resp:  Normal effort  MSK:   Moves extremities without difficulty  Other:    Medical Decision Making  Medically screening exam initiated at 4:09 PM.  Appropriate orders placed.  Alexa Mcpherson was informed that the remainder of the evaluation will be completed by another provider, this initial triage assessment does not replace that evaluation, and the importance of remaining in the ED until their evaluation is complete.     Melton Alar R, PA-C 09/14/23 [id number]

## 2023-09-14 NOTE — ED Triage Notes (Signed)
Pt with left sided CP for past 3 months, recently has been radiating down left arm.  Pt states she has lost her son recently. + SOB with CP

## 2023-09-14 NOTE — ED Provider Notes (Signed)
[place] EMERGENCY DEPARTMENT AT [facility] Provider Note   CSN: [id number] Arrival date & time: 09/14/23  1514     History  Chief Complaint  Patient presents with   Chest Pain    Alexa Mcpherson is a 82 y.o. female.  HPI    82 year old female comes in with chief complaint of chest pain.  Patient has known history of CAD status post CABG few years ago, carotid artery disease history, diabetes and GERD.  She indicates that over the last 3 months she has been having intermittent episodes of chest pain.  Chest pain is described as dull.  She is also experienced pain between her shoulder blade.  The pain is intermittent, she will have few episodes a day.  The pain is typically unprovoked.  In the last few days he has also been having pain radiating down her left upper extremity.  She also lost her son about 2 weeks ago.  In general, the symptoms are not associated with exertion, but there was 1 occasion where she was getting groceries and she felt significantly weak and had to ask for help.  She saw her PCP today, and they advised that she come to the ER.  Patient indicates that her cardiology care is at [facility], they last saw her early this year or late last year.  She has not had any recent stress test.  When patient was found to have coronary artery disease, she her symptoms were that of GERD.  Patient has not taken nitroglycerin for her symptoms.  Currently she is chest pain-free.  Home Medications Prior to Admission medications   Medication Sig Start Date End Date Taking? Authorizing Provider  aspirin buffered (BUFFERIN) 325 MG TABS tablet Take 1 tablet by mouth daily. 09/30/14  Yes [provider]  B Complex-Folic Acid (B COMPLEX VITAMINS, W/ FA,) CAPS Take 1 capsule by mouth daily. 09/17/20  Yes [provider]  diazepam (VALIUM) 2 MG tablet Take 2 mg by mouth daily as needed. 08/30/23  Yes [provider]  DULoxetine (CYMBALTA) 60 MG capsule  Take 1 capsule by mouth daily. 04/16/21  Yes [provider]  ACCU-CHEK GUIDE test strip 3 (three) times daily. 07/17/20   [provider]  amLODipine (NORVASC) 5 MG tablet Take by mouth. 09/19/20 09/19/21  [provider]  aspirin 325 MG EC tablet Take 325 mg by mouth daily. 07/11/20   [provider]  b complex vitamins capsule Take 1 capsule by mouth daily.    [provider]  cefUROXime (CEFTIN) 500 MG tablet Take 500 mg by mouth 2 (two) times daily. 07/21/20   [provider]  Cholecalciferol 25 MCG (1000 UT) tablet Take by mouth.    [provider]  cimetidine (TAGAMET) 400 MG tablet Take 400 mg by mouth 2 (two) times daily.    [provider]  clopidogrel (PLAVIX) 75 MG tablet Take 75 mg by mouth daily. 07/28/20   [provider]  [employer] ASPIRIN 325 MG tablet Take 325 mg by mouth daily. 06/05/18   [provider]  [employer] MELATONIN 3 MG TABS Take 6 mg by mouth at bedtime. 08/03/18   [provider]  DULoxetine (CYMBALTA) 20 MG capsule Take by mouth.    [provider]  DULoxetine (CYMBALTA) 30 MG capsule Take 30 mg by mouth daily. 09/16/20   [provider]  ezetimibe (ZETIA) 10 MG tablet Take by mouth. 09/22/20   [provider]  gabapentin (NEURONTIN)  300 MG capsule Take by mouth. 04/21/20   [provider]  glipiZIDE (GLUCOTROL) 5 MG tablet Take 5 mg by mouth at bedtime.     [provider]  HUMULIN 70/30 KWIKPEN (70-30) 100 UNIT/ML PEN Inject 20 Units into the skin 2 (two) times daily. 07/24/18   [provider]  hydrochlorothiazide (HYDRODIURIL) 25 MG tablet Take 25 mg by mouth 2 (two) times daily. 07/22/18   [provider]  HYDROcodone-acetaminophen (NORCO/VICODIN) 5-325 MG tablet Take 1 tablet by mouth every 6 (six) hours as needed for moderate pain. 10/09/20   Eldred Manges, MD  ibuprofen (ADVIL) 800 MG tablet Take 800 mg by mouth every 6 (six)  hours as needed for mild pain, headache or fever. 07/04/23   [provider]  ibuprofen (ADVIL,MOTRIN) 200 MG tablet Take 400 mg by mouth every 8 (eight) hours as needed (for pain).    [provider]  insulin NPH Human (NOVOLIN N) 100 UNIT/ML injection Inject into the skin.    [provider]  linaclotide Karlene Einstein) 145 MCG CAPS capsule Take by mouth. 08/13/17   [provider]  lisinopril (PRINIVIL,ZESTRIL) 40 MG tablet Take 40 mg by mouth daily. 08/13/18   [provider]  lisinopril (ZESTRIL) 20 MG tablet Take 20 mg by mouth daily. 08/16/21   [provider]  lisinopril (ZESTRIL) 5 MG tablet Take 5 mg by mouth daily. 08/15/19   [provider]  metFORMIN (GLUCOPHAGE-XR) 500 MG 24 hr tablet Take 1,000 mg by mouth 2 (two) times daily. 06/19/18   [provider]  methocarbamol (ROBAXIN) 500 MG tablet Take 500 mg by mouth 2 (two) times daily. 04/03/20   [provider]  Omega-3 Fatty Acids (FISH OIL) 1000 MG CAPS Take by mouth.    [provider]  omeprazole (PRILOSEC) 40 MG capsule Take 40 mg by mouth daily. 08/14/18   [provider]  OZEMPIC, 0.25 OR 0.5 MG/DOSE, 2 MG/3ML SOPN Inject 0.25 mg into the skin once a week. 07/08/23   [provider]  PARoxetine (PAXIL) 40 MG tablet Take 40 mg by mouth daily.     [provider]  PREMARIN vaginal cream Place vaginally daily. 07/21/20   [provider]  primidone (MYSOLINE) 50 MG tablet Take 200 mg by mouth 2 (two) times daily. 06/23/18   [provider]  propranolol (INDERAL) 20 MG tablet Take 40 mg by mouth 2 (two) times daily.     [provider]  propranolol (INDERAL) 40 MG tablet Take 40 mg by mouth 2 (two) times daily. 09/22/21   [provider]  rosuvastatin (CRESTOR) 20 MG tablet Take 20 mg by mouth daily. 07/24/18   [provider]  rosuvastatin (CRESTOR) 40 MG tablet Take 40 mg by mouth daily.  08/27/21   [provider]  Semaglutide,0.25 or 0.5MG /DOS, (OZEMPIC, 0.25 OR 0.5 MG/DOSE,) 2 MG/1.5ML SOPN Inject 0.25 mg into the skin once a week.    [provider]  tolterodine (DETROL LA) 4 MG 24 hr capsule Take 4 mg by mouth daily at 3 pm. (1600) 07/20/18   [provider]  UNABLE TO FIND 2 times daily. Novalin    [provider]  Zinc 50 MG CAPS Take by mouth.    [provider]      Allergies    Penicillins    Review of Systems   Review of Systems  All other systems reviewed and are negative.   Physical Exam Updated Vital  Signs BP (!) 156/72   Pulse 68   Temp 98 F (36.7 C) (Oral)   Resp 19   Ht 5\' 1"  (1.549 m)   Wt 54.4 kg   SpO2 96%   BMI 22.67 kg/m  Physical Exam Vitals and nursing note reviewed.  Constitutional:      Appearance: She is well-developed.  HENT:     Head: Atraumatic.  Cardiovascular:     Rate and Rhythm: Normal rate.  Pulmonary:     Effort: Pulmonary effort is normal.  Musculoskeletal:     Cervical back: Normal range of motion and neck supple.     Right lower leg: No edema.     Left lower leg: No edema.  Skin:    General: Skin is warm and dry.  Neurological:     Mental Status: She is alert and oriented to person, place, and time.     ED Results / Procedures / Treatments   Labs (all labs ordered are listed, but only abnormal results are displayed) Labs Reviewed  BASIC METABOLIC PANEL - Abnormal; Notable for the following components:      Result Value   Sodium 134 (*)    Potassium 5.3 (*)    Glucose, Bld 119 (*)    All other components within normal limits  CBC - Abnormal; Notable for the following components:   Hemoglobin 11.4 (*)    All other components within normal limits  TROPONIN I (HIGH SENSITIVITY)  TROPONIN I (HIGH SENSITIVITY)    EKG EKG Interpretation Date/Time:  Wednesday September 14 2023 15:24:48 EDT Ventricular Rate:  73 PR Interval:  146 QRS Duration:  74 QT  Interval:  362 QTC Calculation: 398 R Axis:   46  Text Interpretation: Normal sinus rhythm ST & T wave abnormality, consider lateral ischemia Abnormal ECG When compared with ECG of 24-Aug-2018 11:47, Questionable change in QRS axis No acute changes No significant change since last tracing possible u waves Confirmed by Derwood Kaplan [id number]) on 09/14/2023 7:46:59 PM  Radiology DG Chest 2 View  Result Date: 09/14/2023 CLINICAL DATA:  Chest pain, left arm pain EXAM: CHEST - 2 VIEW COMPARISON:  None Available. FINDINGS: Lungs are clear.  No pneumothorax. Heart size and mediastinal contours are within normal limits. CABG markers. Aortic Atherosclerosis (ICD10-170.0). No effusion. Sternotomy wires. IMPRESSION: No acute cardiopulmonary disease. Electronically Signed   By: Corlis Leak M.D.   On: 09/14/2023 16:21    Procedures Procedures    Medications Ordered in ED Medications - No data to display  ED Course/ Medical Decision Making/ A&P                                 Medical Decision Making Amount and/or Complexity of Data Reviewed Labs: ordered. Radiology: ordered.   This patient presents to the ED with chief complaint(s) of chest pain with pertinent past medical history of coronary artery disease status post CABG, bilateral carotid artery disease and metabolic syndrome.The complaint involves an extensive differential diagnosis and also carries with it a high risk of complications and morbidity.    The differential diagnosis considered for this patient includes  ACS syndrome Stable angina or angina pectoris Myocarditis Pericarditis PE Pneumothorax Musculoskeletal pain PUD / Gastritis / Esophagitis Esophageal spasm  The initial plan is to get basic cardiac labs Initial EKG is reassuring.  Additional history obtained: Records reviewed Care Everywhere/External Records.  Patient had echo within the last year, but  no stress test.  Independent labs interpretation:  The following  labs were independently interpreted: Troponins x 2 are reassuring.  CBC is normal.  Independent visualization and interpretation of imaging: - I independently visualized the following imaging with scope of interpretation limited to determining acute life threatening conditions related to emergency care: X-ray of the chest, which revealed no evidence of pneumothorax.  Treatment and Reassessment: Patient reassessed after initial workup. I indicated to the patient that she has troponins that are normal.  She is a moderately high risk candidate for CAD, given that she has had CAD status post CABG and has bilateral carotid artery disease.  Our recommendation would be that we call cardiology to get neck step recommendations.  They might want to consider keeping patient in the hospital for stress test despite her troponins being normal.  Patient however indicates that she would prefer going home.  She will call her cardiologist at [facility] tomorrow and make sure that she is seen soon.  She will call 911 if her symptoms return or get worse.  Family member at the bedside, also comfortable with the plan.  Patient gives commitment to [place] that she will call 911 if her symptoms get worse.  Patient understands that her decision might lead to undiagnosed impending heart attack and that she is at risk for increased morbidity and mortality.  Patient is reasonable and appears to be making sound judgment.  She is stable for discharge with return precautions at this time.  She will call cardiologist tomorrow to set up an appointment. Final Clinical Impression(s) / ED Diagnoses Final diagnoses:  Angina pectoris [facility])    Rx / DC Orders ED Discharge Orders     None         Derwood Kaplan, MD 09/14/23 2051

## 2023-09-14 NOTE — ED Notes (Signed)
ED Provider at bedside. 

## 2023-09-22 DIAGNOSIS — Z6821 Body mass index (BMI) 21.0-21.9, adult: Secondary | ICD-10-CM | POA: Diagnosis not present

## 2023-09-22 DIAGNOSIS — I10 Essential (primary) hypertension: Secondary | ICD-10-CM | POA: Diagnosis not present

## 2023-09-22 DIAGNOSIS — I209 Angina pectoris, unspecified: Secondary | ICD-10-CM | POA: Diagnosis not present

## 2023-09-29 DIAGNOSIS — I071 Rheumatic tricuspid insufficiency: Secondary | ICD-10-CM | POA: Diagnosis not present

## 2023-09-29 DIAGNOSIS — I209 Angina pectoris, unspecified: Secondary | ICD-10-CM | POA: Diagnosis not present

## 2023-10-25 DIAGNOSIS — R809 Proteinuria, unspecified: Secondary | ICD-10-CM | POA: Diagnosis not present

## 2023-11-03 DIAGNOSIS — R0989 Other specified symptoms and signs involving the circulatory and respiratory systems: Secondary | ICD-10-CM | POA: Diagnosis not present

## 2023-11-03 DIAGNOSIS — I2510 Atherosclerotic heart disease of native coronary artery without angina pectoris: Secondary | ICD-10-CM | POA: Diagnosis not present

## 2023-11-03 DIAGNOSIS — E782 Mixed hyperlipidemia: Secondary | ICD-10-CM | POA: Diagnosis not present

## 2023-11-03 DIAGNOSIS — I6523 Occlusion and stenosis of bilateral carotid arteries: Secondary | ICD-10-CM | POA: Diagnosis not present

## 2023-11-03 DIAGNOSIS — I10 Essential (primary) hypertension: Secondary | ICD-10-CM | POA: Diagnosis not present

## 2023-11-07 DIAGNOSIS — G250 Essential tremor: Secondary | ICD-10-CM | POA: Diagnosis not present

## 2023-11-07 DIAGNOSIS — Z1231 Encounter for screening mammogram for malignant neoplasm of breast: Secondary | ICD-10-CM | POA: Diagnosis not present

## 2023-12-27 DIAGNOSIS — Z6821 Body mass index (BMI) 21.0-21.9, adult: Secondary | ICD-10-CM | POA: Diagnosis not present

## 2023-12-27 DIAGNOSIS — E785 Hyperlipidemia, unspecified: Secondary | ICD-10-CM | POA: Diagnosis not present

## 2023-12-27 DIAGNOSIS — M5431 Sciatica, right side: Secondary | ICD-10-CM | POA: Diagnosis not present

## 2023-12-27 DIAGNOSIS — I209 Angina pectoris, unspecified: Secondary | ICD-10-CM | POA: Diagnosis not present

## 2023-12-27 DIAGNOSIS — E1122 Type 2 diabetes mellitus with diabetic chronic kidney disease: Secondary | ICD-10-CM | POA: Diagnosis not present

## 2023-12-27 DIAGNOSIS — E1121 Type 2 diabetes mellitus with diabetic nephropathy: Secondary | ICD-10-CM | POA: Diagnosis not present

## 2023-12-27 DIAGNOSIS — N182 Chronic kidney disease, stage 2 (mild): Secondary | ICD-10-CM | POA: Diagnosis not present

## 2023-12-27 DIAGNOSIS — I10 Essential (primary) hypertension: Secondary | ICD-10-CM | POA: Diagnosis not present

## 2024-03-22 DIAGNOSIS — Z0000 Encounter for general adult medical examination without abnormal findings: Secondary | ICD-10-CM | POA: Diagnosis not present

## 2024-03-22 DIAGNOSIS — N3000 Acute cystitis without hematuria: Secondary | ICD-10-CM | POA: Diagnosis not present

## 2024-03-22 DIAGNOSIS — N182 Chronic kidney disease, stage 2 (mild): Secondary | ICD-10-CM | POA: Diagnosis not present

## 2024-03-22 DIAGNOSIS — M5431 Sciatica, right side: Secondary | ICD-10-CM | POA: Diagnosis not present

## 2024-03-22 DIAGNOSIS — E7849 Other hyperlipidemia: Secondary | ICD-10-CM | POA: Diagnosis not present

## 2024-03-22 DIAGNOSIS — I10 Essential (primary) hypertension: Secondary | ICD-10-CM | POA: Diagnosis not present

## 2024-03-22 DIAGNOSIS — Z6821 Body mass index (BMI) 21.0-21.9, adult: Secondary | ICD-10-CM | POA: Diagnosis not present

## 2024-03-22 DIAGNOSIS — E1122 Type 2 diabetes mellitus with diabetic chronic kidney disease: Secondary | ICD-10-CM | POA: Diagnosis not present

## 2024-06-15 DIAGNOSIS — G510 Bell's palsy: Secondary | ICD-10-CM | POA: Diagnosis not present

## 2024-06-15 DIAGNOSIS — E119 Type 2 diabetes mellitus without complications: Secondary | ICD-10-CM | POA: Diagnosis not present

## 2024-06-15 DIAGNOSIS — H40023 Open angle with borderline findings, high risk, bilateral: Secondary | ICD-10-CM | POA: Diagnosis not present

## 2024-06-15 DIAGNOSIS — H04123 Dry eye syndrome of bilateral lacrimal glands: Secondary | ICD-10-CM | POA: Diagnosis not present

## 2024-07-12 DIAGNOSIS — I10 Essential (primary) hypertension: Secondary | ICD-10-CM | POA: Diagnosis not present

## 2024-07-12 DIAGNOSIS — E1122 Type 2 diabetes mellitus with diabetic chronic kidney disease: Secondary | ICD-10-CM | POA: Diagnosis not present

## 2024-07-12 DIAGNOSIS — M5431 Sciatica, right side: Secondary | ICD-10-CM | POA: Diagnosis not present

## 2024-07-12 DIAGNOSIS — N182 Chronic kidney disease, stage 2 (mild): Secondary | ICD-10-CM | POA: Diagnosis not present

## 2024-07-12 DIAGNOSIS — Z6820 Body mass index (BMI) 20.0-20.9, adult: Secondary | ICD-10-CM | POA: Diagnosis not present

## 2024-07-12 DIAGNOSIS — Z0000 Encounter for general adult medical examination without abnormal findings: Secondary | ICD-10-CM | POA: Diagnosis not present

## 2024-07-12 DIAGNOSIS — E7849 Other hyperlipidemia: Secondary | ICD-10-CM | POA: Diagnosis not present

## 2024-08-06 DIAGNOSIS — I10 Essential (primary) hypertension: Secondary | ICD-10-CM | POA: Diagnosis not present

## 2024-08-06 DIAGNOSIS — E7849 Other hyperlipidemia: Secondary | ICD-10-CM | POA: Diagnosis not present

## 2024-10-03 NOTE — Progress Notes (Signed)
 Alexa Mcpherson                                          MRN: [id number]   10/03/2024   The [facility]BCI Quality Team Specialist reviewed this patient medical record for the purposes of chart review for care gap closure. The following were reviewed: chart review for care gap closure-kidney health evaluation for diabetes:eGFR  and uACR.    VBCI Quality Team

## 2024-10-15 ENCOUNTER — Other Ambulatory Visit: Payer: Self-pay | Admitting: Internal Medicine

## 2024-10-15 DIAGNOSIS — M5431 Sciatica, right side: Secondary | ICD-10-CM

## 2024-10-26 NOTE — Discharge Instructions (Signed)

## 2024-10-29 ENCOUNTER — Ambulatory Visit
Admission: RE | Admit: 2024-10-29 | Discharge: 2024-10-29 | Disposition: A | Discharge status: Home / Self Care | Source: Ambulatory Visit | Attending: Internal Medicine | Admitting: Internal Medicine

## 2024-10-29 ENCOUNTER — Other Ambulatory Visit: Payer: Self-pay | Admitting: Internal Medicine

## 2024-10-29 DIAGNOSIS — M5431 Sciatica, right side: Secondary | ICD-10-CM

## 2024-10-29 MED ORDER — IOPAMIDOL (ISOVUE-M 200) INJECTION 41%
1.0000 mL | Freq: Once | INTRAMUSCULAR | Status: AC
  Administered 2024-10-29: 1 mL via EPIDURAL

## 2024-10-29 MED ORDER — METHYLPREDNISOLONE ACETATE 40 MG/ML INJ SUSP (RADIOLOG
80.0000 mg | Freq: Once | INTRAMUSCULAR | Status: AC
  Administered 2024-10-29: 80 mg via EPIDURAL

## 2024-11-02 NOTE — Discharge Summary (Signed)
 ------------------------------------------------------------------------------- Attestation signed by Haywood Jacob Russel, MD at 11/04/24 [phone] I have personally seen this patient independently, personally examined the patient, and reviewed the current data. I agree with the assessment and plan as outlined by Waddell Oak NP: I have reviewed the information contained in this note and personally verified its accuracy.   Vitals:   11/04/24 0828  BP:   Pulse:   Resp:   Temp: 97.7 F (36.5 C)  SpO2:     General: Alert, oriented, NAD, well nourished, well developed Lungs: Normal work of breathing. Heart: well perfused Extremities: no edema.  Assessment/Plan  Patient presented with chest pain, found to have acute coronary syndrome. LHC showed with no good PCI targets. Also found to have left subclavian stenosis.  NSTEMI ultimately medically managed with 48 hours heparin, aspirin and plavix. Will follow up as outpatient to consider stent to subclavian     Haywood Russel [facility] 11/04/2024  -------------------------------------------------------------------------------  [facility] DISCHARGE SUMMARY  PCP: ORPHA YANCEY LABOR, MD Discharge Details   Admit date:       11/01/2024 Discharge date:       11/04/2024   Discharge Diagnoses: Active Hospital Problems   Diagnosis Date Noted POA  . *NSTEMI (non-ST elevated myocardial infarction) (*) 11/01/2024 Yes  . Unstable angina (*) 11/01/2024 Unknown    Resolved Hospital Problems  No resolved problems to display.    Hospital Course  Alexa Mcpherson 83 y.o. female with a past medical history including obstructive coronary artery disease status post prior CABG, hypertension, hyperlipidemia, diabetes mellitus type 2, chronic kidney disease, anxiety and depression who presented to the hospital on 11/01/2024  as a transfer from [facility] emergency department where she presented with a chief complaint of chest pain. She  reported severe chest pain that was described as heartburn with no other associated symptoms. EKG showed normal sinus rhythm with ST depression in 1, aVL and V2 with Q waves in V1. Initial workup showed troponin I 26>>449 with delta 423, creatinine 1.26, AST 67, WBC 16.4, hemoglobin 10.5 and hematocrit 31.4. Chest x-ray showed no acute abnormality. She was diagnosed with an NSTEMI and transferred to [facility] for further evaluation and management of NSTEMI. She was scheduled for a left heart catheterization for NSTEMI. Cardiac catheterization revealed obstructive three-vessel coronary artery disease with 2 out of 4 bypass grafts patent (see detailed report below). She was maintained on IV heparin for 48 hours and was on IV NTG for pain control. Both of these were weaned/discontinued without issue. Echocardiogram showed severe hypokinesis of the apical wall with an ejection fraction of 50-55%.  During her hospital course she reported dysuria, urinary analysis revealed findings consistent with UTI. She was managed with IV antibiotics [Rocephin] for 3 days and discharged on Keflex BID. Also continued on magnesium oxide BID for 7 days due to hypomagnesemia.   Cardiac Profile: Obstructive coronary artery disease  Hypertension  Hyperlipidemia   Patient will be managed with GDMT for coronary artery disease with Aspirin, Statin, Beta blocker, ACEi. Will also be discharged on DAPT with the Aspirin and Plavix  for a minimum of 12 months. Patient verbalizes understanding of the importance of compliance with their guideline directed medical therapy.   You have been referred to outpatient Cardiac Rehabilitation. Education has been provided regarding the benefits of Cardiac Rehab, including improved health, improved quality of life, and reduced risk of hospitalizations. The Cardiac Rehab team will contact you in the next few days after you get  home to discuss entering your local program.   At the time  of discharge the patient is without chest pain or shortness of breath. Denies palpitations, dizziness or CP with exertion. Cardiac cath site is clean, dry, and intact. There are no obvious signs of bleeding. Tele has been stable without acute arrhythmias. A review of medications and diagnostics has been performed prior to discharge.   Pertinent Procedures and Tests:   Cardiac Catheterization:   Heart Cath-Left Diagnostic      Conclusion  DATE OF PROCEDURE: 11/02/2024   PRE-PROCEDURE DIAGNOSIS: Non-ST segment elevation myocardial infarction   POST-PROCEDURE DIAGNOSIS: Obstructive three-vessel coronary artery disease with 2 out of 4 bypass grafts patent   PROCEDURES PERFORMED:  1. Left heart catheterization 2. Left ventriculography 3. Coronary angiography 4. Bypass graft angiography   APPROACH: Right femoral artery access with micropuncture kit and ultrasound guidance, closed with Angio-Seal   ESTIMATED BLOOD LOSS: <20 cc   TISSUE SAMPLES OBTAINED: None   ANESTHESIA: Under my direct supervision, 2mg  of versed  and of Fentanyl  were administered intravenously for moderate sedation.  Pulse oximetry, heart rate, and BP were continuously monitored by an independent trained observer who was present throughout the case.  The procedure start time was 0752 and the end time was 0835.   FINDINGS:   Coronary Angiography 1. Left Main -95% distal 2. Left anterior descending artery -100% proximal 3. Diagonals -small diffusely diseased diagonal 4. Left Circumflex -small AV groove circumflex 5. Obtuse Marginals -100% proximal obtuse marginal 1 6. Right Coronary Artery -100% mid 7.  LIMA to the LAD is widely patent; there is a 60% left subclavian stenosis with a 30 mm gradient across this lesion 8.  Saphenous vein graft to the PDA is widely patent and gives collaterals to the diagonal 9.  Saphenous vein graft to the diagonal is occluded 10.  Saphenous vein graft to the obtuse marginal is  occluded   Hemodynamics 1. Aortic Pressure -185/59 mmHg 2. Left Ventricular -185/10 mmHg Additional comments on on hemodynamics: There is a significant stenosis across the left subclavian.  The pressure distal to this stenosis is 165/60 and the pressure proximal to the stenosis is 195/60.  This is a 30 mm peak to peak gradient   Left Ventriculography -ejection fraction 50 to 55% with severe anterolateral hypokinesis.    CONCLUSIONS:  Successful transfemoral cardiac catheterization Obstructive three-vessel coronary artery disease with 2 out of 4 bypass grafts patent Left ventricular diastolic pressure 10 Significant left subclavian stenosis Ejection fraction 50 to 55%   RECOMMENDATIONS: The patient's presentation is either consistent with Takotsubo cardiomyopathy versus a non-ST segment elevation myocardial infarction.  Assuming this is an acute coronary syndrome, I believe her culprit lesion is likely the native diagonal given the fact that she has an anterior lateral wall motion abnormality.  I do not see a good PCI target for this.  Fortunately, the diagonal appears to be collateralized from the vein graft to the RCA.  I would recommend medical therapy with loading with Plavix.  She will continue on aspirin and Plavix.  She will be maintained on IV heparin for 48 hours.  She will be placed on IV nitroglycerin for pain control.  As an outpatient I would recommend follow-up with cardiology.  At that time we can discuss potentially placing a stent to the left subclavian artery given the significant stenosis which is supplying the LIMA to the LAD.    The imaging is on file and stored in a permanent location.  EBL  Estimated Blood Loss: 5 mL(s). Total Contrast  Total Contrast Used: 95 mL(s).  Specimen(s) Collected  No specimens were removed during this exam PRESSURES   Systolic (mmHg) Diastolic (mmHg) Mean (mmHg) A Wave (mmHg) V Wave (mmHg) EDP (mmHg) HR (bpm)  AO Pressures 187   59    99     0   84    Art Pressures 195    124     67     LV Pressures 170   1      10   84     169   3      18   88        Linked Documents  View Intra-Procedure Log Results <redacted file path>   Signed  Electronically signed by Jayson JINNY Bihari, MD on 11/02/24 at 0859 EST   Echocardiogram:Echocardiogram Complete WO Enhancing Agent Result Date: 11/02/2024 .  Left Ventricle: Systolic function is low normal. EF: 50-55%. Ejection fraction measured by 3D is 50%, There is severe hypokinesis of the apical wall. Doppler parameters consistent with mild diastolic dysfunction and low to normal LA pressure. .  Mitral Valve: The leaflets are mildly thickened. There is mild annular calcification.   Lab Results  Component Value Date   Hemoglobin A1c 7.0 (H) 04/16/2016   CHOLESTEROL TOTAL 122 11/02/2024   HDL 61 11/02/2024   LDL 41 11/02/2024   Trig 102 11/02/2024   WBC 7.3 11/04/2024   HGB 9.9 (L) 11/04/2024   Plt Ct 273 11/04/2024   Creatinine 0.60 11/04/2024   BUN 13 11/04/2024   Na 130 (L) 11/04/2024   Potassium 4.4 11/04/2024   Cl 99 11/04/2024   CO2 21 11/04/2024   INR 1.1 11/01/2024   TSH 2.37 11/02/2024   ALT 21 11/01/2024   AST 28 11/01/2024   ALK PHOS 53 11/01/2024   T Bili 0.4 11/01/2024   No results found for: BNP  CK-MB  Date Value Ref Range Status  09/24/2014 1.54 0.00 - 5.34 ng/mL Final   CKMB  Date Value Ref Range Status  04/16/2016 2.49 0.00 - 5.34 ng/mL Final   CK  Date Value Ref Range Status  04/16/2016 89 24 - 173 IU/L Final  09/24/2014 47 24 - 173 IU/L Final   Troponin T  Date Value Ref Range Status  09/24/2014 <0.010 <=0.009 ng/mL Final    Comment:    A Cardiac Troponin T level of  0.010 ng/mL or greater indicates myocardial damage. Elevated troponin may  also be due to pulmonary emboli, aortic dissection, heart  failure, renal failure, inflammation , trauma, toxins, etc., and ischemia in the setting of critical illness.    Trop T   Date Value Ref Range Status  04/16/2016 <0.010 <0.010 Final    Comment:    A Cardiac Troponin T level of  0.010 ng/mL or greater indicates myocardial damage. Elevated troponin may  also be due to pulmonary emboli, aortic dissection, heart  failure, renal failure, inflammation , trauma, toxins, etc., and ischemia in the setting of critical illness.     Discharge Medications     Current Discharge Medication List     START taking these medications      Details  aspirin EC tablet Commonly known as: ECOTRIN LOW DOSE Start taking on: November 05, 2024  Take one tablet (81 mg dose) by mouth daily. Quantity: 30 tablet   cephALEXin 500 mg capsule Commonly known as: KEFLEX  Take one capsule (500 mg  dose) by mouth 2 (two) times daily for 10 days. Quantity: 20 capsule   clopidogrel bisulfate 75 mg tablet Commonly known as: PLAVIX Start taking on: November 05, 2024  Take one tablet (75 mg dose) by mouth daily. Quantity: 30 tablet   magnesium oxide 400 Tabs Commonly known as: MAG-OX  Take one tablet (400 mg dose) by mouth 2 (two) times daily for 7 days. Quantity: 14 tablet   nitroGLYCERIN 0.4 mg SL tablet Commonly known as: NITROSTAT  Place one tablet (0.4 mg dose) under the tongue every 5 (five) minutes as needed for Chest pain. Quantity: 30 tablet       CONTINUE these medications which have CHANGED      Details  lisinopril 20 mg tablet Commonly known as: PRINIVIL,ZESTRIL What changed: when to take this  TAKE 1 TABLET BY MOUTH TWICE A DAY Quantity: 180 tablet   metFORMIN  ER 500 mg 24 hr tablet Commonly known as: GLUCOPHAGE -XR What changed: Another medication with the same name was removed. Continue taking this medication, and follow the directions you see here.  Take two tablets (1,000 mg dose) by mouth 2 (two) times daily.   NOVOLIN 70/30 FLEXPEN RELION  What changed: Another medication with the same name was removed. Continue taking this medication, and  follow the directions you see here.  Inject 20 Units into the skin 2 (two) times daily.   propranolol  HCl 40 mg tablet Commonly known as: INDERAL  What changed: Another medication with the same name was removed. Continue taking this medication, and follow the directions you see here.  Take one tablet (40 mg dose) by mouth 2 (two) times daily.       CONTINUE these medications which have NOT CHANGED      Details  amLODIPine besylate 5 mg tablet Commonly known as: NORVASC  TAKE 1 TABLET BY MOUTH EVERY DAY Quantity: 90 tablet   cholecalciferol 1,000 units (25 mcg) tablet Commonly known as: VITAMIN D-3  Take one tablet (1,000 Units dose) by mouth daily.   DULoxetine HCl 30 mg capsule Commonly known as: CYMBALTA  Take one capsule (30 mg dose) by mouth daily.   ezetimibe 10 MG tablet Commonly known as: ZETIA  TAKE ONE TABLET BY MOUTH DAILY. Quantity: 90 tablet   fish oil 1000 mg Caps Commonly known as: SEA-OMEGA  Take one capsule (1,000 mg dose) by mouth 3 (three) times a day.   gabapentin 300 mg capsule Commonly known as: NEURONTIN  Take one capsule (300 mg dose) by mouth 2 (two) times daily.   primidone 50 mg tablet Commonly known as: MYSOLINE  Take four tablets (200 mg dose) by mouth 2 (two) times daily.   rosuvastatin calcium  40 mg tablet Commonly known as: CRESTOR  TAKE 1 TABLET BY MOUTH DAILY. APPOINTMENT AND LAB WORK REQUIRED FOR FUTURE REFILLS. PLS CALL OFFICE Quantity: 90 tablet   Zinc 50 MG Caps  Take one capsule (50 mg dose) by mouth every morning.      * You might also be taking other medications not listed above. If you have questions about any of your other medications, talk to the person who prescribed them or your Primary Care Provider.          STOP taking these medications    aspirin buffered 325 MG Tabs Commonly known as: BUFFERIN,TRI-BUFFERED ASPIRIN   insulin  NPH 100 units/mL injection Commonly known as: HUMULIN N,NOVOLIN N   LINZESS  145 MCG capsule Generic drug: linaclotide   OZEMPIC (0.25 OR 0.5 MG/DOSE)  2 MG/1.5ML Sopn pen Generic drug: Semaglutide(0.25 or 0.5MG /DOSE)       Discharge Plan  1. Dietary modifications with plans to initiate Mediterranean diet. 2. Continue with smoking cessation plans if you smoke (consider initiation of Chantix as an out patient if needed) 3. Increase exercise for a goal of at least 30 minutes per day for most days of the week for a goal of 150 minutes per week. 4. Follow up with Dr. Dorisann as scheduled 11/20/24 6. Do not discontinue medications without seeking consultation from cardiology. 7. Follow up with PCP to update on testing/new diagnosis. 8. Seek ED services if you develop sudden onset of shortness of breath or chest pains. 9. Call our office if you have questions about your diagnosis or treatment plan. 10.Our office will call you to schedule your appointments in approximately 3-5 days. If you do not hear from them by day 5 please call our office.  11. Referral for cardiac rehabilitation  Appointments which have been scheduled    Nov 20, 2024 9:40 AM Follow Up Appointment with Norleen CHRISTELLA Dorisann, MD [facility]) (--) [address] [place] [address] [phone]   Apr 10, 2025 1:00 PM Ultrasound Carotid with TRA US  ROOM 1 [facility] [id number] [place] CHRISTIANNA FONDER [place] [address] [phone]   Apr 10, 2025 1:45 PM Follow Up Appointment with TRA EXAM ROOM 1 [facility] [address] [place] CH[place]STIAN[place] FONDER [place] [address] [phone]      ACS DISCHARGE DATA   Type of ACS  NSTEMI  Aspirin yes  Other Antiplatelet agent (Clopidogrel, Prasugrel, Ticagrelor) yes  Statin yes  Beta blocker yes  ACEI / ARB/ENTRESTO yes  LVEF 50-55% on 11/02/2024  Cardiac Rehab referral made: yes    Time spent in discharge process: 40 minutes Electronically  signed:   Waddell CHRISTELLA Oak, FNP 11/04/2024, 9:02 AM     Thank you for allowing me to participate in this patient's care.  Note: This documented was generated using voice recognition software. There may be unintended transcription errors that were not detected upon document review.  *Some images could not be shown.

## 2024-11-04 NOTE — Nursing Note (Signed)
 All PIV and telemetry leads removed from pt. Pt dressed self. Pt alert and agrees with discharge. AVS given, educated on medication and when to return to hospital. Pt states understanding. Pt escorted to pharmacy and then personal vehicle by unit [facility].

## 2024-11-05 ENCOUNTER — Telehealth: Payer: Self-pay

## 2024-11-05 NOTE — Transitions of Care (Post Inpatient/ED Visit) (Unsigned)
 11/07/2024  Name: Alexa Mcpherson MRN: [id number] DOB: [birth year]  Today's TOC FU Call Status: Today's TOC FU Call Status:: Successful TOC FU Call Completed TOC FU Call Complete Date: 11/05/24 Patient's Name and Date of Birth confirmed.  Transition Care Management Follow-up Telephone Call Date of Discharge: 11/04/24 Discharge Facility: Other [facility]) Name of Other (Non[facility]) Discharge Facility: [facility] Type of Discharge: Inpatient Admission Primary Inpatient Discharge Diagnosis:: NSTEMI How have you been since you were released from the hospital?: Same (Patient reports feeling tired.) Any questions or concerns?: No  Items Reviewed: Did you receive and understand the discharge instructions provided?: No Medications obtained,verified, and reconciled?: Yes (Medications Reviewed) Any new allergies since your discharge?: No Dietary orders reviewed?: Yes Type of Diet Ordered:: low salt heart healthy Do you have support at home?: Yes People in Home [RPT]: spouse Name of Support/Comfort Primary Source: Alexa Mcpherson  Medications Reviewed Today: Medications Reviewed Today     Reviewed by Sholom Dulude E, RN (Registered Nurse) on 11/05/24 at 1704  Med List Status: <None>   Medication Order Taking? Sig Documenting Provider Last Dose Status Informant  ACCU-CHEK GUIDE test strip [id number] Yes 3 (three) times daily. [provider]  Active   amLODipine (NORVASC) 5 MG tablet [id number] Yes Take by mouth. [provider]  Active   aspirin 325 MG EC tablet [id number]  Take 325 mg by mouth daily.  Patient not taking: Reported on 11/05/2024   [provider]  Active   aspirin buffered (BUFFERIN) 325 MG TABS tablet [id number]  Take 1 tablet by mouth daily.  Patient not taking: Reported on 11/05/2024   [provider]  Active   aspirin EC 81 MG tablet [id number] Yes Take 81 mg by mouth daily. Swallow whole. [provider]  Active   b complex vitamins capsule [id number]  Take 1 capsule by mouth daily. [provider]  Active   B Complex-Folic Acid  (B COMPLEX VITAMINS, W/ FA,) CAPS [id number]  Take 1 capsule by mouth daily. [provider]  Active   cefUROXime (CEFTIN) 500 MG tablet [id number]  Take 500 mg by mouth 2 (two) times daily.  Patient not taking: Reported on 11/05/2024   [provider]  Active   Cholecalciferol 25 MCG (1000 UT) tablet [id number] Yes Take by mouth. [provider]  Active   cimetidine (TAGAMET) 400 MG tablet [id number]  Take 400 mg by mouth 2 (two) times daily.  Patient not taking: Reported on 11/05/2024   [provider]  Active Self  clopidogrel (PLAVIX) 75 MG tablet [id number] Yes Take 75 mg by mouth daily. [provider]  Active   [employer] ASPIRIN 325 MG tablet [id number]  Take 325 mg by mouth daily. [provider]  Active Self  [employer] MELATONIN 3 MG TABS [id number]  Take 6 mg by mouth at bedtime. [provider]  Active Self  diazepam (VALIUM) 2 MG tablet [id number]  Take 2 mg by mouth daily as needed. [provider]  Active   DULoxetine (CYMBALTA) 20 MG capsule [id number]  Take by mouth. [provider]  Active   DULoxetine (CYMBALTA) 30 MG capsule [id number] Yes Take 30 mg by mouth daily. [provider]  Active   DULoxetine (CYMBALTA) 60 MG capsule [id number]  Take 1 capsule by mouth daily. [provider]  Active   ezetimibe (ZETIA) 10 MG tablet [id number] Yes Take by mouth. [provider]  Active   gabapentin (NEURONTIN) 300 MG  capsule [id number] Yes Take by mouth. [provider]  Active   glipiZIDE  (GLUCOTROL ) 5 MG tablet [id number]  Take 5 mg by mouth at bedtime.  [provider]  Active Self  HUMULIN 70/30 KWIKPEN (70-30) 100 UNIT/ML PEN [id number]  Inject 20 Units into the skin 2 (two) times daily. [provider]  Active Self   hydrochlorothiazide (HYDRODIURIL) 25 MG tablet [id number]  Take 25 mg by mouth 2 (two) times daily. [provider]  Active Self  HYDROcodone -acetaminophen  (NORCO/VICODIN) 5-325 MG tablet [id number]  Take 1 tablet by mouth every 6 (six) hours as needed for moderate pain. Barbarann Oneil BROCKS, MD  Active   ibuprofen (ADVIL) 800 MG tablet [id number]  Take 800 mg by mouth every 6 (six) hours as needed for mild pain, headache or fever.  Patient not taking: Reported on 11/05/2024   [provider]  Active   ibuprofen (ADVIL,MOTRIN) 200 MG tablet [id number]  Take 400 mg by mouth every 8 (eight) hours as needed (for pain).  Patient not taking: Reported on 11/05/2024   [provider]  Active Self           Med Note DRENA, CHUCK KANDICE [place] Sep 14, 2023  7:29 PM)    insulin  NPH Human (NOVOLIN N) 100 UNIT/ML injection [id number]  Inject into the skin.  Patient not taking: Reported on 11/05/2024   [provider]  Active   linaclotide LARUE) 145 MCG CAPS capsule [id number]  Take by mouth.  Patient not taking: Reported on 11/05/2024   [provider]  Active   lisinopril (PRINIVIL,ZESTRIL) 40 MG tablet [id number]  Take 40 mg by mouth daily. [provider]  Active Self  lisinopril (ZESTRIL) 20 MG tablet [id number] Yes Take 20 mg by mouth daily. [provider]  Active   lisinopril (ZESTRIL) 5 MG tablet [id number]  Take 5 mg by mouth daily. [provider]  Active   metFORMIN  (GLUCOPHAGE -XR) 500 MG 24 hr tablet [id number] Yes Take 1,000 mg by mouth 2 (two) times daily. [provider]  Active Self  methocarbamol  (ROBAXIN ) 500 MG tablet [id number]  Take 500 mg by mouth 2 (two) times daily. [provider]  Active   Omega-3 Fatty Acids (FISH OIL) 1000 MG CAPS [id number] Yes Take by mouth. [provider]  Active   omeprazole (PRILOSEC) 40 MG capsule [id number]  Take 40 mg by mouth daily. [provider]  Active Self   OZEMPIC, 0.25 OR 0.5 MG/DOSE, 2 MG/3ML SOPN [id number] Yes Inject 0.25 mg into the skin once a week. [provider]  Active   PARoxetine  (PAXIL ) 40 MG tablet [id number]  Take 40 mg by mouth daily.  [provider]  Active Self  PREMARIN vaginal cream [id number]  Place vaginally daily. [provider]  Active   primidone (MYSOLINE) 50 MG tablet [id number] Yes Take 200 mg by mouth 2 (two) times daily. [provider]  Active Self  propranolol  (INDERAL ) 20 MG tablet [id number]  Take 40 mg by mouth 2 (two) times daily.  [provider]  Active Self  propranolol  (INDERAL ) 40 MG tablet [id number] Yes Take 40 mg by mouth 2 (two) times daily. [provider]  Active   rosuvastatin (CRESTOR) 20 MG tablet [id number]  Take 20 mg by mouth daily. [provider]  Active Self  rosuvastatin (CRESTOR) 40 MG tablet [id number] Yes Take 40 mg by mouth daily. [provider]  Active   Semaglutide,0.25 or 0.5MG /DOS, (OZEMPIC, 0.25 OR  0.5 MG/DOSE,) 2 MG/1.5ML SOPN [id number] Yes Inject 0.25 mg into the skin once a week. [provider]  Active   tolterodine (DETROL LA) 4 MG 24 hr capsule [id number]  Take 4 mg by mouth daily at 3 pm. (1600) [provider]  Active Self  UNABLE TO FIND [id number]  2 times daily. Novalin [provider]  Active   Zinc 50 MG CAPS [id number] Yes Take by mouth. [provider]  Active             Home Care and Equipment/Supplies: Were Home Health Services Ordered?: No Any new equipment or medical supplies ordered?: No  Functional Questionnaire: Do you need assistance with bathing/showering or dressing?: No Do you need assistance with meal preparation?: No Do you need assistance with eating?: No Do you have difficulty maintaining continence: No Do you need assistance with getting out of bed/getting out of a chair/moving?: No Do you have difficulty managing or taking your medications?:  No  Follow up appointments reviewed: PCP Follow-up appointment confirmed?: Yes Date of PCP follow-up appointment?: 11/05/24 Follow-up Provider: Dr. Orpha Specialist [facility] Follow-up appointment confirmed?: Yes Date of Specialist follow-up appointment?: 11/18/24 Follow-Up Specialty Provider:: Dr. Dorisann Do you need transportation to your follow-up appointment?: No Do you understand care options if your condition(s) worsen?: Yes-patient verbalized understanding  SDOH Interventions Today    Flowsheet Row Most Recent Value  SDOH Interventions   Food Insecurity Interventions Intervention Not Indicated  Housing Interventions Intervention Not Indicated  Transportation Interventions Intervention Not Indicated  Utilities Interventions Intervention Not Indicated   Discussed and offered 30 day TOC program.  Patient declined.  The patient has been provided with contact information for the care management team and has been advised to call with any health -related questions or concerns.  The patient verbalized understanding with current plan of care.  The patient is directed to their insurance card regarding availability of benefits coverage.    Arvin Seip RN, BSN, CCM [employer], Population Health Case Manager Phone: [phone]

## 2024-11-07 NOTE — Patient Instructions (Signed)
 Visit Information  Thank you for taking time to visit with me today. Please don't hesitate to contact me if I can be of assistance to you   Patient instructions:  Plan to follow the Mediterranean diet. Review Mediterranean education article sent to you in Mychart.  Continue with smoking cessation plans if you smoke (consider initiation of Chantix as an out patient if needed) . Increase exercise for a goal of at least 30 minutes per day for most days of the week for a goal of 150 minutes per week. Follow up with Dr. Dorisann as scheduled 11/20/24. Follow up with PCP to update on testing/new diagnosis. Seek ED services if you develop sudden onset of shortness of breath or chest pains. Call cardiology office if you have questions about your diagnosis or treatment plan.   Patient verbalizes understanding of instructions and care plan provided today and agrees to view in MyChart. Active MyChart status and patient understanding of how to access instructions and care plan via MyChart confirmed with patient.     The patient has been provided with contact information for the care management team and has been advised to call with any health related questions or concerns.   Please call the care guide team at [phone] if you need to cancel or reschedule your appointment.   Please call the Suicide and Crisis Lifeline: 988 call the [place]  National Suicide Prevention Lifeline: 425-216-2363 or TTY: 332-113-9471 TTY 651-286-9055) to talk to a trained counselor call 1-800-273-TALK (toll free, 24 hour hotline) if you are experiencing a Mental Health or Behavioral Health Crisis or need someone to talk to.  Arvin Seip RN, BSN, CCM [employer], Population Health Case Manager Phone: [phone]

## 2024-11-14 ENCOUNTER — Telehealth (HOSPITAL_COMMUNITY): Payer: Self-pay

## 2024-11-14 NOTE — Telephone Encounter (Signed)
 Called patient regarding referral to cardiac rehab from [facility] with NSTEMI. She states she is exercising and is not interested in participating in the program.
